# Patient Record
Sex: Male | Born: 1980 | Race: White | Hispanic: No | Marital: Single | Smoking: Current every day smoker
Health system: Southern US, Community
[De-identification: ages and names within clinical notes are randomized; demographics above are authoritative.]

## PROBLEM LIST (undated history)

## (undated) DIAGNOSIS — G43909 Migraine, unspecified, not intractable, without status migrainosus: Secondary | ICD-10-CM

## (undated) HISTORY — PX: APPENDECTOMY: SHX54

## (undated) HISTORY — PX: EYE SURGERY: SHX253

---

## 2001-11-23 ENCOUNTER — Encounter: Payer: Self-pay | Admitting: Emergency Medicine

## 2001-11-23 ENCOUNTER — Emergency Department (HOSPITAL_COMMUNITY): Admission: EM | Admit: 2001-11-23 | Discharge: 2001-11-23 | Payer: Self-pay | Admitting: Emergency Medicine

## 2001-12-05 ENCOUNTER — Emergency Department (HOSPITAL_COMMUNITY): Admission: EM | Admit: 2001-12-05 | Discharge: 2001-12-05 | Payer: Self-pay | Admitting: *Deleted

## 2002-07-20 ENCOUNTER — Emergency Department (HOSPITAL_COMMUNITY): Admission: EM | Admit: 2002-07-20 | Discharge: 2002-07-20 | Payer: Self-pay | Admitting: Emergency Medicine

## 2002-07-20 ENCOUNTER — Encounter: Payer: Self-pay | Admitting: Emergency Medicine

## 2002-07-21 ENCOUNTER — Emergency Department (HOSPITAL_COMMUNITY): Admission: EM | Admit: 2002-07-21 | Discharge: 2002-07-21 | Payer: Self-pay

## 2004-12-28 ENCOUNTER — Emergency Department (HOSPITAL_COMMUNITY): Admission: EM | Admit: 2004-12-28 | Discharge: 2004-12-28 | Payer: Self-pay | Admitting: Emergency Medicine

## 2011-06-19 ENCOUNTER — Emergency Department (HOSPITAL_COMMUNITY)
Admission: EM | Admit: 2011-06-19 | Discharge: 2011-06-20 | Disposition: A | Discharge status: Other Acute Inpatient Hospital | Payer: 59 | Attending: Emergency Medicine | Admitting: Emergency Medicine

## 2011-06-19 ENCOUNTER — Encounter (HOSPITAL_COMMUNITY): Payer: Self-pay | Admitting: Emergency Medicine

## 2011-06-19 DIAGNOSIS — G479 Sleep disorder, unspecified: Secondary | ICD-10-CM | POA: Insufficient documentation

## 2011-06-19 DIAGNOSIS — IMO0002 Reserved for concepts with insufficient information to code with codable children: Secondary | ICD-10-CM | POA: Insufficient documentation

## 2011-06-19 DIAGNOSIS — R45851 Suicidal ideations: Secondary | ICD-10-CM | POA: Insufficient documentation

## 2011-06-19 DIAGNOSIS — F603 Borderline personality disorder: Secondary | ICD-10-CM | POA: Insufficient documentation

## 2011-06-19 DIAGNOSIS — R45850 Homicidal ideations: Secondary | ICD-10-CM | POA: Insufficient documentation

## 2011-06-19 HISTORY — DX: Migraine, unspecified, not intractable, without status migrainosus: G43.909

## 2011-06-19 LAB — DIFFERENTIAL
Basophils Relative: 0 % (ref 0–1)
Lymphocytes Relative: 11 % — ABNORMAL LOW (ref 12–46)
Lymphs Abs: 1.3 10*3/uL (ref 0.7–4.0)
Monocytes Relative: 4 % (ref 3–12)
Neutro Abs: 10.3 10*3/uL — ABNORMAL HIGH (ref 1.7–7.7)
Neutrophils Relative %: 84 % — ABNORMAL HIGH (ref 43–77)

## 2011-06-19 LAB — RAPID URINE DRUG SCREEN, HOSP PERFORMED
Barbiturates: NOT DETECTED
Opiates: NOT DETECTED
Tetrahydrocannabinol: POSITIVE — AB

## 2011-06-19 LAB — COMPREHENSIVE METABOLIC PANEL
Albumin: 4.9 g/dL (ref 3.5–5.2)
Alkaline Phosphatase: 112 U/L (ref 39–117)
BUN: 14 mg/dL (ref 6–23)
CO2: 26 mEq/L (ref 19–32)
Chloride: 98 mEq/L (ref 96–112)
GFR calc non Af Amer: >90 mL/min (ref >90)
Potassium: 4 mEq/L (ref 3.5–5.1)
Total Bilirubin: 0.4 mg/dL (ref 0.3–1.2)

## 2011-06-19 LAB — CBC
HCT: 46 % (ref 39.0–52.0)
Hemoglobin: 16.3 g/dL (ref 13.0–17.0)
RBC: 4.88 MIL/uL (ref 4.22–5.81)
WBC: 12.3 10*3/uL — ABNORMAL HIGH (ref 4.0–10.5)

## 2011-06-19 MED ORDER — IBUPROFEN 600 MG PO TABS
600.0000 mg | ORAL_TABLET | Freq: Three times a day (TID) | ORAL | Status: DC | PRN
  Administered 2011-06-19: 600 mg via ORAL
  Filled 2011-06-19: qty 1

## 2011-06-19 MED ORDER — NICOTINE 21 MG/24HR TD PT24
21.0000 mg | MEDICATED_PATCH | Freq: Every day | TRANSDERMAL | Status: DC
  Filled 2011-06-19: qty 1

## 2011-06-19 MED ORDER — ONDANSETRON HCL 4 MG PO TABS: 4.0000 mg | ORAL_TABLET | Freq: Three times a day (TID) | ORAL | Status: DC | PRN

## 2011-06-19 MED ORDER — METOCLOPRAMIDE HCL 10 MG PO TABS
10.0000 mg | ORAL_TABLET | Freq: Four times a day (QID) | ORAL | Status: DC | PRN
  Administered 2011-06-19: 10 mg via ORAL
  Filled 2011-06-19: qty 1

## 2011-06-19 MED ORDER — HYDROCODONE-ACETAMINOPHEN 5-325 MG PO TABS
2.0000 | ORAL_TABLET | Freq: Once | ORAL | Status: AC
  Administered 2011-06-19: 2 via ORAL
  Filled 2011-06-19: qty 2

## 2011-06-19 MED ORDER — ACETAMINOPHEN 325 MG PO TABS: 650.0000 mg | ORAL_TABLET | ORAL | Status: DC | PRN

## 2011-06-19 MED ORDER — LORAZEPAM 1 MG PO TABS: 1.0000 mg | ORAL_TABLET | Freq: Three times a day (TID) | ORAL | Status: DC | PRN

## 2011-06-19 NOTE — Progress Notes (Signed)
[phone] Taylor Rowe Please call if pt needs ride home

## 2011-06-19 NOTE — ED Notes (Signed)
Pt would like to talk to someone about issues with anger. Pt states he doesn't want to hurt himself right now but when he became angry last night, he thought about driving his truck off of a bridge. Pt states he feels like he could hurt someone else when he becomes angry. Pt calm and cooperative at this time.

## 2011-06-19 NOTE — ED Provider Notes (Signed)
Typical daily headache lasts hours not better with APAP/Motrin so will try Reglan po, denies sudden or severe or change in H/A from typical, denies change speech/vision/weak/numb. 1950  Hurman Horn, MD 06/20/11 1322

## 2011-06-19 NOTE — ED Notes (Signed)
All of pt's stuff sent home with family prior to coming to Psych ED
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
## 2011-06-19 NOTE — ED Provider Notes (Signed)
History     CSN: [id number]  Arrival date & time 06/19/11  1114   First MD Initiated Contact with Patient 06/19/11 1206      Chief Complaint  Patient presents with  . Medical Clearance    (Consider location/radiation/quality/duration/timing/severity/associated sxs/prior treatment) HPI Comments: Patient presents with complaints of fits of anger that he cannot control. These have been becoming more frequent and intense. He states that when he is feeling angry he has thoughts of hurting himself (driving off a bridge) and hurting other people -- however he denies this at current time. Occasional alcohol, marijuana, and prescription drug use. No medical complaints at this time.   Patient is a 31 y.o. male presenting with mental health disorder. The history is provided by the patient.  Mental Health Problem  The onset of the illness is precipitated by emotional stress. Additional symptoms of the illness do not include no headaches or no abdominal pain. He contemplates harming himself. He contemplates injuring another person.    Past Medical History  Diagnosis Date  . Migraines     Past Surgical History  Procedure Date  . Eye surgery   . Appendectomy     History reviewed. No pertinent family history.  History  Substance Use Topics  . Smoking status: Current Everyday Smoker -- 1.0 packs/day  . Smokeless tobacco: Not on file  . Alcohol Use: 1.2 oz/week    2 Cans of beer per week     most days      Review of Systems  Constitutional: Negative for fever.  HENT: Negative for sore throat and rhinorrhea.   Eyes: Negative for redness.  Respiratory: Negative for cough.   Cardiovascular: Negative for chest pain.  Gastrointestinal: Negative for nausea, vomiting, abdominal pain and diarrhea.  Genitourinary: Negative for dysuria.  Musculoskeletal: Negative for myalgias.  Skin: Negative for rash.  Neurological: Negative for headaches.  Psychiatric/Behavioral: Positive for suicidal  ideas, behavioral problems and sleep disturbance.    Allergies  Other  Home Medications   Current Outpatient Rx  Name Route Sig Dispense Refill  . ADULT MULTIVITAMIN W/MINERALS CH Oral Take 1 tablet by mouth daily.      BP 129/82  Pulse 105  Temp(Src) 98.2 F (36.8 C) (Oral)  Ht 5\' 7"  (1.702 m)  Wt 170 lb (77.111 kg)  BMI 26.63 kg/m2  SpO2 100%  Physical Exam  Nursing note and vitals reviewed. Constitutional: He is oriented to person, place, and time. He appears well-developed and well-nourished.  HENT:  Head: Normocephalic and atraumatic.  Eyes: Conjunctivae are normal. Right eye exhibits no discharge. Left eye exhibits no discharge.  Neck: Normal range of motion. Neck supple.  Cardiovascular: Normal rate, regular rhythm and normal heart sounds.   Pulmonary/Chest: Effort normal and breath sounds normal.  Abdominal: Soft. There is no tenderness.  Neurological: He is alert and oriented to person, place, and time.  Skin: Skin is warm and dry.  Psychiatric: He has a normal mood and affect.    ED Course  Procedures (including critical care time)  Labs Reviewed  CBC - Abnormal; Notable for the following:    WBC 12.3 (*)    All other components within normal limits  DIFFERENTIAL - Abnormal; Notable for the following:    Neutrophils Relative 84 (*)    Neutro Abs 10.3 (*)    Lymphocytes Relative 11 (*)    All other components within normal limits  URINE RAPID DRUG SCREEN (HOSP PERFORMED) - Abnormal; Notable for the following:  Amphetamines POSITIVE (*)    Tetrahydrocannabinol POSITIVE (*)    All other components within normal limits  COMPREHENSIVE METABOLIC PANEL  ETHANOL   No results found.   1. Behavior problem     12:22 PM Patient seen and examined. Work-up initiated. Will need ACT consult.   Vital signs reviewed and are as follows: Filed Vitals:   06/19/11 1122  BP: 129/82  Pulse: 105  Temp: 98.2 F (36.8 C)   3:17 PM Spoke with ACT who will see.  Patient is medically cleared.   8:22 PM Handoff to Dr. Fonnie Jarvis.    MDM  Pt denies SI/HI currently, would benefit from ACT eval possible telepsych.         Renne Crigler, [place] 06/19/11 2022

## 2011-06-20 ENCOUNTER — Inpatient Hospital Stay (HOSPITAL_COMMUNITY)
Admission: AD | Admit: 2011-06-20 | Discharge: 2011-06-23 | DRG: 883 | Disposition: A | Discharge status: Home / Self Care | Payer: 59 | Source: Ambulatory Visit | Attending: Psychiatry | Admitting: Psychiatry

## 2011-06-20 DIAGNOSIS — G43909 Migraine, unspecified, not intractable, without status migrainosus: Secondary | ICD-10-CM

## 2011-06-20 DIAGNOSIS — F6381 Intermittent explosive disorder: Principal | ICD-10-CM | POA: Diagnosis present

## 2011-06-20 DIAGNOSIS — F1994 Other psychoactive substance use, unspecified with psychoactive substance-induced mood disorder: Secondary | ICD-10-CM | POA: Diagnosis present

## 2011-06-20 DIAGNOSIS — IMO0002 Reserved for concepts with insufficient information to code with codable children: Secondary | ICD-10-CM

## 2011-06-20 DIAGNOSIS — F4310 Post-traumatic stress disorder, unspecified: Secondary | ICD-10-CM | POA: Diagnosis not present

## 2011-06-20 DIAGNOSIS — Z79899 Other long term (current) drug therapy: Secondary | ICD-10-CM

## 2011-06-20 DIAGNOSIS — F17200 Nicotine dependence, unspecified, uncomplicated: Secondary | ICD-10-CM

## 2011-06-20 DIAGNOSIS — Z888 Allergy status to other drugs, medicaments and biological substances status: Secondary | ICD-10-CM

## 2011-06-20 MED ORDER — ADULT MULTIVITAMIN W/MINERALS CH
1.0000 | ORAL_TABLET | Freq: Every day | ORAL | Status: DC
  Administered 2011-06-21 – 2011-06-23 (×3): 1 via ORAL
  Filled 2011-06-20 (×4): qty 1

## 2011-06-20 MED ORDER — ACETAMINOPHEN 325 MG PO TABS
650.0000 mg | ORAL_TABLET | Freq: Four times a day (QID) | ORAL | Status: DC | PRN
  Administered 2011-06-20 – 2011-06-23 (×5): 650 mg via ORAL

## 2011-06-20 MED ORDER — HYDROXYZINE HCL 50 MG PO TABS
50.0000 mg | ORAL_TABLET | Freq: Every evening | ORAL | Status: DC | PRN
  Administered 2011-06-20 – 2011-06-21 (×4): 50 mg via ORAL
  Filled 2011-06-20 (×8): qty 1

## 2011-06-20 MED ORDER — NICOTINE 14 MG/24HR TD PT24
MEDICATED_PATCH | TRANSDERMAL | Status: AC
  Filled 2011-06-20: qty 1

## 2011-06-20 MED ORDER — ALUM & MAG HYDROXIDE-SIMETH 200-200-20 MG/5ML PO SUSP
30.0000 mL | ORAL | Status: DC | PRN
  Administered 2011-06-21 – 2011-06-22 (×2): 30 mL via ORAL

## 2011-06-20 MED ORDER — HYDROXYZINE HCL 25 MG PO TABS
25.0000 mg | ORAL_TABLET | ORAL | Status: DC | PRN
  Administered 2011-06-21: 25 mg via ORAL
  Filled 2011-06-20: qty 20

## 2011-06-20 MED ORDER — DIVALPROEX SODIUM 500 MG PO DR TAB
500.0000 mg | DELAYED_RELEASE_TABLET | Freq: Two times a day (BID) | ORAL | Status: DC
  Administered 2011-06-20 – 2011-06-22 (×4): 500 mg via ORAL
  Filled 2011-06-20 (×9): qty 1

## 2011-06-20 MED ORDER — NICOTINE 14 MG/24HR TD PT24
14.0000 mg | MEDICATED_PATCH | Freq: Every day | TRANSDERMAL | Status: DC
  Administered 2011-06-22: 14 mg via TRANSDERMAL
  Filled 2011-06-20 (×5): qty 1

## 2011-06-20 MED ORDER — MAGNESIUM HYDROXIDE 400 MG/5ML PO SUSP: 30.0000 mL | Freq: Every day | ORAL | Status: DC | PRN

## 2011-06-20 NOTE — Progress Notes (Signed)
[facility] Group Notes:  (Counselor/Nursing/MHT/Case Management/Adjunct)  06/20/2011 2:35 PM  Type of Therapy:  Group Therapy  Participation Level:  Did Not Attend   Neila Gear 06/20/2011, 2:35 PM

## 2011-06-20 NOTE — Progress Notes (Signed)
06/20/2011 Nursing 2200 Taylor Rowe spent most of his day...sleeping in his bed.he did get up and take his meals in the cafeteria...going back to sleep immediately when he returned to the unit. He is sad, depressed yet laughs and minimizes the extent of his mental health issues. WHen he was confronted by this nurse... ie  if he wanted help he needed to attend his groups as well as pay attention to the group  Discussion,he didn't like it...but after he walked away he returned to speak with this nurse, saying " ok...tell me what I need to do to make this work". A He is started on Depakote this PM per MD order. R Safety is maintaiend . Pt attended his PM Wrap-Up group. Cont to support POC as well as foster therapeutic relationship already establsihed PD RN [facility]

## 2011-06-20 NOTE — [facility] Counselor (Signed)
Pt accepted to [facility] by Dr. Baron Sane to Dr. Tilman Neat services.  He will go to room 500-1 and will be transported by security.  Nurse needs to do:  Call Report - [phone] Complete COBRA Send discharge paper work Arts administrator for transport

## 2011-06-20 NOTE — ED Notes (Signed)
Pt accepted to [facility], bogart to walker 500-1.

## 2011-06-20 NOTE — ED Notes (Signed)
Per Annice Pih at [employer], pt's [facility] coverage has lapsed. Annice Pih informed this Clinical research associate that [facility] does not have any [employer] currently available.

## 2011-06-20 NOTE — ED Provider Notes (Signed)
Pt seen and evaluated in psych Ed, pt sleeping comfortably but arousable and vital signs stable.  He is currently awaiting placement  9:34 AM Pt accepted at [facility] by Dr. Baron Sane.    Ethelda Chick, MD 06/20/11 1248
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
## 2011-06-21 DIAGNOSIS — F909 Attention-deficit hyperactivity disorder, unspecified type: Secondary | ICD-10-CM

## 2011-06-21 DIAGNOSIS — F4310 Post-traumatic stress disorder, unspecified: Secondary | ICD-10-CM | POA: Diagnosis not present

## 2011-06-21 MED ORDER — NICOTINE 21 MG/24HR TD PT24
MEDICATED_PATCH | TRANSDERMAL | Status: AC
  Filled 2011-06-21: qty 1

## 2011-06-21 MED ORDER — BUTALBITAL-APAP-CAFFEINE 50-325-40 MG PO TABS
2.0000 | ORAL_TABLET | Freq: Once | ORAL | Status: AC
  Administered 2011-06-21: 2 via ORAL
  Filled 2011-06-21 (×2): qty 2

## 2011-06-21 NOTE — Progress Notes (Signed)
06/22/2011 Nursing 0900 Taylor Rowe is out in the hall...interacting with the other pts early this AM. He presents to the med window first thing and says he's " ready" for the day.he makes good eye contact. He is pleasant, cooperatvie and articulate. HE smiles boyishly after taking his meds and asking this nurse...." Is that all???". A He completed his self invnentory sheet and on it he wrote he denied SI, he did not rate his depression and hopelessness but stated his DC plan was: " plan to take care of my family and muyself". R Safety is maintained. POC includes continuing to foster therapeutic relationship already established and pt given written material about PTSD and developing healthier coping mechnisms discussed with pt PD RN [facility]

## 2011-06-21 NOTE — Progress Notes (Signed)
[facility] Group Notes:  (Counselor/Nursing/MHT/Case Management/Adjunct)  06/21/2011 3:33 PM  Type of Therapy:  Group Therapy  Participation Level:  Did Not Attend   Taylor Rowe 06/21/2011, 3:33 PM
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
## 2011-06-21 NOTE — Progress Notes (Signed)
Patient ID: Taylor Rowe, male   DOB: [birth year], 31 y.o.   MRN: [id number] Pt. Reports anger issues for most of his life, reports he does know what it's from. Writer encouraged pt. To explore childhood he may find the answer, also encouraged pt. To be aware of the negatively that anger can cause. Pt. Reports irritability also. Pt. Denies depression. Staff will monitor q81min for safety. Pt. Remains safe on the floor.

## 2011-06-21 NOTE — H&P (Signed)
Patient was seen for suicidal assessment and case discussed with Ms. Adams and agree with admission and treatment plan.  

## 2011-06-21 NOTE — Progress Notes (Signed)
06/21/2011  Nursing 1500 Taylor Rowe has done fair today...he was up first thing this morning....coming to the medications window and asking " what's next???/" He remains depressed, flat and minimizing the lattitude of his problems, frequently he laughs and jokes and makes insulting comments that degrade HIM for have mental illness. A He  Completed his self inventory sheet and on it he wrote he denied SI and wrote his DC plan was : " plan to take care of my family"...  R safety is maintained and POC includes continuing to foster therapeutic relationsjip and maintain boundaries with pt already establsihed PD RN [facility]

## 2011-06-21 NOTE — Progress Notes (Signed)
[facility] Adult Inpatient Family/Significant Other Suicide Prevention Education  Suicide Prevention Education:  Education Completed; Taylor Rowe-[phone]-Fiance- has been identified by the patient as the family member/significant other with whom the patient will be residing, and identified as the person(s) who will aid the patient in the event of a mental health crisis (suicidal ideations/suicide attempt).  With written consent from the patient, the family member/significant other has been provided the following suicide prevention education, prior to the and/or following the discharge of the patient.  The suicide prevention education provided includes the following:  Suicide risk factors  Suicide prevention and interventions  National Suicide Hotline telephone number  [facility] assessment telephone number  [facility] Emergency Assistance 911  [facility] and/or Residential Mobile Crisis Unit telephone number  Request made of family/significant other to:  Remove weapons (e.g., guns, rifles, knives), all items previously/currently identified as safety concern.  Pt.'s girlfriend will secure guns in the home. She states there is s shot gun and two BB guns that she will remove and take to her mothers home.   Remove drugs/medications (over-the-counter, prescriptions, illicit drugs), all items previously/currently identified as a safety concern. Pt.'s girl friend will secure home. She had no concerns  Pt.'s girlfriend discussed pt.' s problem with anger and situation that brought pt. to [facility]. Pt.'s girlfriend wants pt. To get help and feels the pt. Would benefit from individual counseling. Pt. Stated he would like a referral also. Pt.'s girlfriend can be reached at the number above.  The family member/significant other verbalizes understanding of the suicide prevention education information provided.  The family member/significant other agrees to remove the items of safety  concern listed above.  Taylor Rowe 06/21/2011, 11:48 AM

## 2011-06-21 NOTE — Progress Notes (Signed)
Pt requested repeat vistaril at 2320. Worried he would not be able to sleep however medication was effective. His RR are even, unlabored and WNL. Level III obs maintained. Pt safe. Lawrence Marseilles

## 2011-06-21 NOTE — Progress Notes (Signed)
Patient ID: Taylor Rowe, male   DOB: 14-Mar-1981, 31 y.o.   MRN: [id number]  Pt. attended and participated in aftercare planning group. Pt. listed their current anxiety level as 0 and depression as 0. Pt shared that he was not feeling S/I and H/I "not today."

## 2011-06-22 DIAGNOSIS — F6381 Intermittent explosive disorder: Principal | ICD-10-CM

## 2011-06-22 MED ORDER — TRAZODONE HCL 100 MG PO TABS
100.0000 mg | ORAL_TABLET | Freq: Every day | ORAL | Status: DC
  Administered 2011-06-22: 100 mg via ORAL
  Filled 2011-06-22 (×3): qty 1

## 2011-06-22 MED ORDER — BUTALBITAL-APAP-CAFFEINE 50-325-40 MG PO TABS
2.0000 | ORAL_TABLET | ORAL | Status: AC
  Administered 2011-06-22: 2 via ORAL
  Filled 2011-06-22 (×2): qty 2

## 2011-06-22 MED ORDER — NICOTINE 21 MG/24HR TD PT24
21.0000 mg | MEDICATED_PATCH | Freq: Every day | TRANSDERMAL | Status: DC
  Administered 2011-06-22 – 2011-06-23 (×2): 21 mg via TRANSDERMAL
  Filled 2011-06-22 (×2): qty 1

## 2011-06-22 MED ORDER — DIVALPROEX SODIUM ER 500 MG PO TB24
1000.0000 mg | ORAL_TABLET | Freq: Every day | ORAL | Status: DC
  Administered 2011-06-22: 1000 mg via ORAL
  Filled 2011-06-22 (×3): qty 2

## 2011-06-22 MED ORDER — NICOTINE 21 MG/24HR TD PT24
MEDICATED_PATCH | TRANSDERMAL | Status: AC
  Administered 2011-06-22: 09:00:00
  Filled 2011-06-22: qty 1

## 2011-06-22 MED ORDER — TRAZODONE HCL 50 MG PO TABS
50.0000 mg | ORAL_TABLET | Freq: Every evening | ORAL | Status: DC | PRN
Start: 2011-06-22 — End: 2011-06-22
  Filled 2011-06-22 (×4): qty 1
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
## 2011-06-22 NOTE — Progress Notes (Signed)
Pt given pain medication for his headache that he rated a 3. Wanted to know what the medication was and was happy when he found out that it was a narcotic. Given support and reassurance.

## 2011-06-22 NOTE — Progress Notes (Signed)
[facility] Group Notes:  (Counselor/Nursing/MHT/Case Management/Adjunct) 11:00am   Type of Therapy:  Group Therapy  Participation Level:  Did Not Attend  Billie Lade 06/22/2011  12:55 PM

## 2011-06-22 NOTE — ED Provider Notes (Signed)
Medical screening examination/treatment/procedure(s) were performed by non-physician practitioner and as supervising physician I was immediately available for consultation/collaboration.   Benny Lennert, MD 06/22/11 1116

## 2011-06-22 NOTE — Progress Notes (Signed)
Patient ID: Taylor Rowe, male   DOB: 03-14-1981, 31 y.o.   MRN: [id number] Pt. Attended group. Pt. Reports concerns about discharge. Pt. Denies SHI. Pt. Reports Anxiety at "4", "I'm not irritated or anything." Pt. Reports he has several resource set up for discharge. Pt. Says he didn't get to see the physician today and wants to talk to him about discharge. Staff will monitor q21min for safety. Pt. Remains safe on the unit.
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
## 2011-06-22 NOTE — Progress Notes (Signed)
[facility] Group Notes: (Counselor/Nursing/MHT/Case Management/Adjunct) 06/22/2011   @1 :15pm  Type of Therapy:  Group Therapy  Participation Level:    Participation Quality:    Affect:    Cognitive:  Appropriate  Insight:    Engagement in Group:   Engagement in Therapy:    Modes of Intervention:  Support and Exploration  Summary of Progress/Problems: Taylor Rowe was engaged in group, though he had a tendency to be off-topic and distracting. He spoke about the link among prompting events, emotions and behaviors, pointing out that sometimes negative behaviors are worth the consequences if you believe in the reason/principle behind it. This began a discussion of value-focused behavior, and Taylor Rowe made several comments about men not being able to hit women, but being permitted to hit other men. Other group members pointed out that this is a matter of belief, and that some people believe that hitting anyone is never justified. Taylor Rowe agreed with this and went on to talk about his reactions to anger.   Billie Lade 06/22/2011 3:31 PM

## 2011-06-23 DIAGNOSIS — F4310 Post-traumatic stress disorder, unspecified: Secondary | ICD-10-CM

## 2011-06-23 DIAGNOSIS — F1994 Other psychoactive substance use, unspecified with psychoactive substance-induced mood disorder: Secondary | ICD-10-CM

## 2011-06-23 MED ORDER — DIVALPROEX SODIUM ER 500 MG PO TB24: 1000.0000 mg | ORAL_TABLET | Freq: Every day | ORAL | Status: DC

## 2011-06-23 MED ORDER — ADULT MULTIVITAMIN W/MINERALS CH: 1.0000 | ORAL_TABLET | Freq: Every day | ORAL | Status: DC

## 2011-06-23 MED ORDER — HYDROXYZINE HCL 25 MG PO TABS: 25.0000 mg | ORAL_TABLET | ORAL | Status: AC | PRN

## 2011-06-23 MED ORDER — TRAZODONE HCL 100 MG PO TABS: 100.0000 mg | ORAL_TABLET | Freq: Every day | ORAL | Status: AC
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
## 2011-06-23 NOTE — Progress Notes (Signed)
[facility] Case Management Discharge Plan:  Will you be returning to the same living situation after discharge: Yes,  return to own home At discharge, do you have transportation home?:Yes,  has transportation home Do you have the ability to pay for your medications:Yes,  access to meds, samples provided  Interagency Information:     Release of information consent forms completed and in the chart;  Patient's signature needed at discharge.  Patient to Follow up at:  Follow-up Information    Follow up with [facility] on 06/24/2011. (Appointment scheduled at 2:00 pm with Mickie Hillier. (therapy))    Contact information:   [address] [place], [place] [address] [phone]      Follow up with [facility] on 06/25/2011. (Appointment scheduled at 2:30 pm with Robin B. (medication management))    Contact information:   [address] [place], [place] [address] [phone]         Patient denies SI/HI:   Yes,  denies SI/HI    Safety Planning and Suicide Prevention discussed:  Yes,  discussed with pt  Barrier to discharge identified:No.  Summary and Recommendations: Pt attended discharge planning group and actively participated.  Pt presents with calm mood and affect.  Pt ranks depression at a 0 and anxiety at a 3, stating he is ready to d/c.  Pt denies SI.  No recommendations from SW.  No further needs voiced by pt.  Pt stable to discharge.     Carmina Miller 06/23/2011, 10:26 AM

## 2011-06-23 NOTE — Progress Notes (Signed)
Grief and loss group  Facilitated grief and loss group on [address] [facility]. Discussed group rules and limits of privacy/confidentiality. Discussed types of loss and normal grief reactions. Invited group members to share experiences w/ grief; topics focused on death/dying and interpersonal losses. Group was initially quiet but progressively engaged, was supportive of one another.   Pt was active and engaged in group, shared w/ group that he had encountered loss earlier in life but did not elaborate. Pt stated that he has dealt w/ grief by trying to move forward and care for self, recognized this is hard. Pt was supportive of other members and empathized w/ those struggling w/ death.  Ambra Haverstick B MS, LPCA, NCC

## 2011-06-23 NOTE — Tx Team (Signed)
Interdisciplinary Treatment Plan Update (Adult)  Date:  06/23/2011  Time Reviewed:  10:22 AM   Progress in Treatment: Attending groups: Yes Participating in groups:  Yes Taking medication as prescribed: Yes Tolerating medication:  Yes Family/Significant other contact made:  Yes Patient understands diagnosis:  Yes Discussing patient identified problems/goals with staff:  Yes Medical problems stabilized or resolved:  Yes Denies suicidal/homicidal ideation: Yes Issues/concerns per patient self-inventory:  None identified Other: N/A  New problem(s) identified: None Identified  Reason for Continuation of Hospitalization: Stable to d/c  Interventions implemented related to continuation of hospitalization: Stable to d/c  Additional comments: N/A  Estimated length of stay: D/C today  Discharge Plan: Pt will follow up at [facility] for medication management and therapy.    New goal(s): N/A  Review of initial/current patient goals per problem list:    1.  Goal(s): Reduce depressive and anxiety symptoms  Met:  Yes   Target date: by discharge  As evidenced by: Reducing depression from a 10 to a 3 as reported by pt. Pt denies having depression and anxiety.  2.  Goal (s): Reduce/Eliminate suicidal ideation  Met:  Yes  Target date: by discharge  As evidenced by: pt reporting no SI.    3.  Goal(s): Anger Issues  Met:  Yes  Target date: by discharge  As evidenced by: Pt reports having calm mood and was referred to therapist that works with anger issues   Attendees: Patient:  Taylor Rowe 06/23/2011 10:24 AM   Family:     Physician:  Franchot Gallo, MD  06/23/2011  10:22 AM   Nursing:   Roswell Miners, RN 06/23/2011 10:24 AM   Case Manager:  Reyes Ivan, LCSWA 06/23/2011  10:22 AM   Counselor:  Angus Palms, LCSW 06/23/2011  10:22 AM   Other:  Juline Patch, LCSW 06/23/2011  10:22 AM   Other:  06/23/2011  10:22 AM   Other:     Other:       Scribe for Treatment Team:   Carmina Miller, 06/23/2011 , 10:22 AM

## 2011-06-23 NOTE — Progress Notes (Signed)
Patient ID: Taylor Rowe, male   DOB: 06/14/1980, 31 y.o.   MRN: [id number] He has been up and to groups interacting with peers and staff. Requested and received tylenol nor neck pain" stated he slept on it wrong". He denies SI thoughts, slept  Well, appetite improving, energy normal,and attention is good.

## 2011-06-23 NOTE — Progress Notes (Signed)
Patient ID: Taylor Rowe, male   DOB: Aug 20, 1980, 31 y.o.   MRN: [id number] Pt. Discharged home. He voiced that he understood discharge instructions and follow up plan. He denies thoughts of harming self  Or others. All belongings taken home with him.
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
## 2011-06-26 NOTE — Progress Notes (Signed)
Patient Discharge Instructions:  Psychiatric Admission Assessment Note Provided,  06/25/2011 After Visit Summary (AVS) Provided,  06/25/2011 Face Sheet Provided, 06/25/2011 Faxed/Sent to the Next Level Care provider:  06/25/2011 Sent Suicide Risk Assessment - Discharge Assessment 06/25/2011  Faxed to [facility] counseling @ [phone]  Wandra Scot, 06/26/2011, 1:13 PM

## 2011-06-26 NOTE — Discharge Summary (Signed)
I agree with this D/C Summary.  

## 2011-07-03 ENCOUNTER — Emergency Department (HOSPITAL_COMMUNITY)
Admission: EM | Admit: 2011-07-03 | Discharge: 2011-07-03 | Disposition: A | Discharge status: Home / Self Care | Payer: 59 | Attending: Emergency Medicine | Admitting: Emergency Medicine

## 2011-07-03 DIAGNOSIS — F17200 Nicotine dependence, unspecified, uncomplicated: Secondary | ICD-10-CM | POA: Insufficient documentation

## 2011-07-03 DIAGNOSIS — G43909 Migraine, unspecified, not intractable, without status migrainosus: Secondary | ICD-10-CM | POA: Insufficient documentation

## 2011-07-03 DIAGNOSIS — Z79899 Other long term (current) drug therapy: Secondary | ICD-10-CM | POA: Insufficient documentation

## 2011-07-03 DIAGNOSIS — Z5189 Encounter for other specified aftercare: Secondary | ICD-10-CM

## 2011-07-03 MED ORDER — DIVALPROEX SODIUM 500 MG PO DR TAB: 500.0000 mg | DELAYED_RELEASE_TABLET | Freq: Once | ORAL | Status: DC

## 2011-07-03 NOTE — ED Provider Notes (Signed)
5:01 PM Pt care assumed from PA Greene. Depakote level not toxic. Dose will be reduced to help with side effects. Pt to be discharged home.  Shaaron Adler, [place] 07/03/11 1702

## 2011-07-03 NOTE — ED Notes (Signed)
Started on Rx for mood disorder 2 weeks ago. States started making him feel "weird..nausea/vomiting...feeling flushed".

## 2011-07-03 NOTE — ED Provider Notes (Signed)
History     CSN: [id number]  Arrival date & time 07/03/11  1231   First MD Initiated Contact with Patient 07/03/11 1237      Chief Complaint  Patient presents with  . Allergic Reaction    Started new med for mood disorder 2 weeks ago    (Consider location/radiation/quality/duration/timing/severity/associated sxs/prior treatment) HPI  Patient presents to the ED with complaints of not being able to tolerate is Depakote medication well. He states that he has been taking it for two weeks and has noticed that he has felt flushed and shaky. He says it makes him feel "weird". He denies having weakness, rash, difficulty breathing, lethargy, LOC or any other complaints.  Past Medical History  Diagnosis Date  . Migraines     Past Surgical History  Procedure Date  . Eye surgery   . Appendectomy     No family history on file.  History  Substance Use Topics  . Smoking status: Current Everyday Smoker -- 1.0 packs/day  . Smokeless tobacco: Not on file  . Alcohol Use: 1.2 oz/week    2 Cans of beer per week     most days      Review of Systems  All other systems reviewed and are negative.    Allergies  Other  Home Medications   Current Outpatient Rx  Name Route Sig Dispense Refill  . GOODY HEADACHE PO Oral Take 1 packet by mouth every 6 (six) hours as needed. pain    . DIVALPROEX SODIUM ER 500 MG PO TB24 Oral Take 2 tablets (1,000 mg total) by mouth at bedtime. For mood control 60 tablet 0  . HYDROXYZINE HCL 25 MG PO TABS Oral Take 1 tablet (25 mg total) by mouth every 4 (four) hours as needed for anxiety. For anxiety and sleep 120 tablet 0  . ADULT MULTIVITAMIN W/MINERALS CH Oral Take 1 tablet by mouth daily. Vitamin supplement 30 tablet 0  . TRAZODONE HCL 100 MG PO TABS Oral Take 1 tablet (100 mg total) by mouth at bedtime. For sleep. 30 tablet 0  . DIVALPROEX SODIUM 500 MG PO TBEC Oral Take 1 tablet (500 mg total) by mouth once. 30 tablet 0    BP 117/75  Pulse 94   Temp(Src) 97.7 F (36.5 C) (Oral)  Resp 20  SpO2 100%  Physical Exam  Nursing note and vitals reviewed. Constitutional: He appears well-developed and well-nourished. No distress.  HENT:  Head: Normocephalic and atraumatic.  Eyes: Pupils are equal, round, and reactive to light.  Neck: Normal range of motion. Neck supple.  Cardiovascular: Normal rate and regular rhythm.   Pulmonary/Chest: Effort normal.  Abdominal: Soft.  Neurological: He is alert.  Skin: Skin is warm and dry.    ED Course  Procedures (including critical care time)   Labs Reviewed  VALPROIC ACID LEVEL   No results found.   1. Encounter for medication adjustment       MDM  Pt received Telepsych for medication adjustments. He recommends ordering Depakote levels to ensure that the patients levels are not toxic. So long as the patients levels are within normal ranges, the patient can take Depakote 500 mg qd, instead of Depakote 1000mg  qd.  Pt telepsych done by Sela Hua, MD.   End of shift care handed off to [facility], PA-C. If patient Depakote levels are toxic then he needs to be admitted. Otherwise, his prescription has been changed to 500mg  qd and he needs to follow-up with his psychiatrist.  Pt has been advised of the symptoms that warrant their return to the ED. Patient has voiced understanding and has agreed to follow-up with the PCP or specialist.     Dorthula Matas, PA 07/03/11 1609

## 2011-07-03 NOTE — ED Notes (Signed)
Paper work sent to [employer]. Confirmed receipt.

## 2011-07-03 NOTE — ED Provider Notes (Signed)
Medical screening examination/treatment/procedure(s) were performed by non-physician practitioner and as supervising physician I was immediately available for consultation/collaboration.   Lyanne Co, MD 07/03/11 [phone]

## 2011-07-04 NOTE — ED Provider Notes (Signed)
Medical screening examination/treatment/procedure(s) were performed by non-physician practitioner and as supervising physician I was immediately available for consultation/collaboration.   Lyanne Co, MD 07/04/11 1046

## 2011-10-04 ENCOUNTER — Encounter (HOSPITAL_COMMUNITY): Payer: Self-pay | Admitting: Emergency Medicine

## 2011-10-04 ENCOUNTER — Emergency Department (HOSPITAL_COMMUNITY)
Admission: EM | Admit: 2011-10-04 | Discharge: 2011-10-04 | Disposition: A | Discharge status: Home / Self Care | Payer: 59 | Attending: Emergency Medicine | Admitting: Emergency Medicine

## 2011-10-04 DIAGNOSIS — Z760 Encounter for issue of repeat prescription: Secondary | ICD-10-CM

## 2011-10-04 DIAGNOSIS — F319 Bipolar disorder, unspecified: Secondary | ICD-10-CM | POA: Insufficient documentation

## 2011-10-04 DIAGNOSIS — F17200 Nicotine dependence, unspecified, uncomplicated: Secondary | ICD-10-CM | POA: Insufficient documentation

## 2011-10-04 DIAGNOSIS — Z9089 Acquired absence of other organs: Secondary | ICD-10-CM | POA: Insufficient documentation

## 2011-10-04 MED ORDER — LORAZEPAM 1 MG PO TABS: 1.0000 mg | ORAL_TABLET | Freq: Three times a day (TID) | ORAL | Status: AC | PRN

## 2011-10-04 MED ORDER — DIVALPROEX SODIUM ER 500 MG PO TB24: 500.0000 mg | ORAL_TABLET | Freq: Every day | ORAL | Status: DC

## 2011-10-04 NOTE — ED Notes (Signed)
Pt alert, nad, arrives from home, presents requesting different medication for "bipolar", denies SI/HI, resp even unlabored, skin pwd

## 2011-10-04 NOTE — ED Provider Notes (Signed)
History     CSN: [id number]  Arrival date & time 10/04/11  2056   First MD Initiated Contact with Patient 10/04/11 2305      Chief Complaint  Patient presents with  . Medication Refill     The history is provided by the patient.   the patient reports a history of bipolar disorder.  He was started on Depakote and Ativan several months ago and had some improvement in his symptoms but then did not like the medication made him feel and thus he stopped his medications.  He now presents the emergency department stating he actually feels worse than when he was on his medications and would like to be back on medications at this time.  The patient was previously in Depakote and Ativan.  He has no homicidal or suicidal thoughts.  He has no other complaints.  Denies difficulty sleeping and denies impulsivity.  Nothing worsens the symptoms.  Nothing improves his symptoms.  Symptoms are constant  Past Medical History  Diagnosis Date  . Migraines     Past Surgical History  Procedure Date  . Eye surgery   . Appendectomy     No family history on file.  History  Substance Use Topics  . Smoking status: Current Everyday Smoker -- 1.0 packs/day  . Smokeless tobacco: Not on file  . Alcohol Use: 1.2 oz/week    2 Cans of beer per week     most days      Review of Systems  All other systems reviewed and are negative.    Allergies  Other  Home Medications   Current Outpatient Rx  Name Route Sig Dispense Refill  . DIVALPROEX SODIUM ER 500 MG PO TB24 Oral Take 1 tablet (500 mg total) by mouth daily. 30 tablet 1  . LORAZEPAM 1 MG PO TABS Oral Take 1 tablet (1 mg total) by mouth 3 (three) times daily as needed for anxiety. 25 tablet 0    BP 126/79  Pulse 83  Temp 98.3 F (36.8 C) (Oral)  Resp 18  SpO2 100%  Physical Exam  Nursing note and vitals reviewed. Constitutional: He is oriented to person, place, and time. He appears well-developed and well-nourished.  HENT:  Head:  Normocephalic and atraumatic.  Eyes: EOM are normal.  Neck: Normal range of motion.  Cardiovascular: Normal rate.   Pulmonary/Chest: Effort normal. No respiratory distress.  Abdominal: He exhibits no distension.  Musculoskeletal: Normal range of motion.  Neurological: He is alert and oriented to person, place, and time.  Skin: Skin is warm and dry.  Psychiatric: He has a normal mood and affect. His behavior is normal. Judgment and thought content normal. His mood appears not anxious. His speech is not rapid and/or pressured. He is not aggressive, is not hyperactive and not combative. Cognition and memory are normal. He does not express impulsivity. He does not exhibit a depressed mood. He expresses no homicidal and no suicidal ideation.    ED Course  Procedures (including critical care time)  Labs Reviewed - No data to display No results found.   1. Medication refill       MDM  The patient be restarted on his same medications.  He was instructed to call psychiatrist for followup as an outpatient for adjustment of his medications.  These are the same medications he was discharged home on.        Lyanne Co, MD 10/04/11 2340

## 2012-03-22 ENCOUNTER — Emergency Department (HOSPITAL_COMMUNITY): Payer: 59

## 2012-03-22 ENCOUNTER — Emergency Department (HOSPITAL_COMMUNITY)
Admission: EM | Admit: 2012-03-22 | Discharge: 2012-03-22 | Disposition: A | Discharge status: Home / Self Care | Payer: 59 | Attending: Emergency Medicine | Admitting: Emergency Medicine

## 2012-03-22 DIAGNOSIS — Y92410 Unspecified street and highway as the place of occurrence of the external cause: Secondary | ICD-10-CM | POA: Insufficient documentation

## 2012-03-22 DIAGNOSIS — Z8679 Personal history of other diseases of the circulatory system: Secondary | ICD-10-CM | POA: Insufficient documentation

## 2012-03-22 DIAGNOSIS — S30811A Abrasion of abdominal wall, initial encounter: Secondary | ICD-10-CM

## 2012-03-22 DIAGNOSIS — S139XXA Sprain of joints and ligaments of unspecified parts of neck, initial encounter: Secondary | ICD-10-CM | POA: Insufficient documentation

## 2012-03-22 DIAGNOSIS — Y9389 Activity, other specified: Secondary | ICD-10-CM | POA: Insufficient documentation

## 2012-03-22 DIAGNOSIS — F17200 Nicotine dependence, unspecified, uncomplicated: Secondary | ICD-10-CM | POA: Insufficient documentation

## 2012-03-22 DIAGNOSIS — S161XXA Strain of muscle, fascia and tendon at neck level, initial encounter: Secondary | ICD-10-CM

## 2012-03-22 DIAGNOSIS — M545 Low back pain: Secondary | ICD-10-CM

## 2012-03-22 DIAGNOSIS — IMO0002 Reserved for concepts with insufficient information to code with codable children: Secondary | ICD-10-CM | POA: Insufficient documentation

## 2012-03-22 DIAGNOSIS — S20219A Contusion of unspecified front wall of thorax, initial encounter: Secondary | ICD-10-CM

## 2012-03-22 DIAGNOSIS — M546 Pain in thoracic spine: Secondary | ICD-10-CM

## 2012-03-22 LAB — PROTIME-INR
INR: 0.88 (ref 0.00–1.49)
Prothrombin Time: 11.9 seconds (ref 11.6–15.2)

## 2012-03-22 LAB — COMPREHENSIVE METABOLIC PANEL
ALT: 18 U/L (ref 0–53)
CO2: 25 mEq/L (ref 19–32)
Calcium: 9.8 mg/dL (ref 8.4–10.5)
GFR calc Af Amer: >90 mL/min (ref >90)
GFR calc non Af Amer: >90 mL/min (ref >90)
Glucose, Bld: 92 mg/dL (ref 70–99)
Sodium: 142 mEq/L (ref 135–145)
Total Bilirubin: 0.2 mg/dL — ABNORMAL LOW (ref 0.3–1.2)

## 2012-03-22 LAB — ETHANOL: Alcohol, Ethyl (B): <11 mg/dL (ref 0–11)

## 2012-03-22 LAB — CBC WITH DIFFERENTIAL/PLATELET
Eosinophils Relative: 3 % (ref 0–5)
HCT: 43.9 % (ref 39.0–52.0)
Lymphocytes Relative: 19 % (ref 12–46)
Lymphs Abs: 2.2 10*3/uL (ref 0.7–4.0)
MCV: 95.4 fL (ref 78.0–100.0)
Monocytes Absolute: 0.6 10*3/uL (ref 0.1–1.0)
Platelets: 246 10*3/uL (ref 150–400)
RBC: 4.6 MIL/uL (ref 4.22–5.81)
WBC: 11.4 10*3/uL — ABNORMAL HIGH (ref 4.0–10.5)

## 2012-03-22 MED ORDER — MORPHINE SULFATE 4 MG/ML IJ SOLN
4.0000 mg | Freq: Once | INTRAMUSCULAR | Status: AC
  Administered 2012-03-22: 4 mg via INTRAVENOUS
  Filled 2012-03-22: qty 1

## 2012-03-22 MED ORDER — OXYCODONE-ACETAMINOPHEN 5-325 MG PO TABS: 2.0000 | ORAL_TABLET | ORAL | Status: DC | PRN

## 2012-03-22 MED ORDER — HYDROMORPHONE HCL PF 1 MG/ML IJ SOLN
1.0000 mg | Freq: Once | INTRAMUSCULAR | Status: AC
  Administered 2012-03-22: 1 mg via INTRAVENOUS
  Filled 2012-03-22: qty 1

## 2012-03-22 MED ORDER — IOHEXOL 300 MG/ML  SOLN: 100.0000 mL | Freq: Once | INTRAMUSCULAR | Status: DC | PRN

## 2012-03-22 MED ORDER — IBUPROFEN 600 MG PO TABS: 600.0000 mg | ORAL_TABLET | Freq: Four times a day (QID) | ORAL | Status: DC | PRN
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
## 2012-08-06 ENCOUNTER — Emergency Department (HOSPITAL_COMMUNITY): Payer: 59

## 2012-08-06 ENCOUNTER — Observation Stay (HOSPITAL_COMMUNITY)
Admission: EM | Admit: 2012-08-06 | Discharge: 2012-08-07 | Disposition: A | Discharge status: Home / Self Care | Payer: 59 | Attending: Orthopedic Surgery | Admitting: Orthopedic Surgery

## 2012-08-06 DIAGNOSIS — Y249XXA Unspecified firearm discharge, undetermined intent, initial encounter: Secondary | ICD-10-CM | POA: Insufficient documentation

## 2012-08-06 DIAGNOSIS — S61409A Unspecified open wound of unspecified hand, initial encounter: Principal | ICD-10-CM | POA: Insufficient documentation

## 2012-08-06 DIAGNOSIS — S62309B Unspecified fracture of unspecified metacarpal bone, initial encounter for open fracture: Secondary | ICD-10-CM | POA: Insufficient documentation

## 2012-08-06 DIAGNOSIS — W3400XA Accidental discharge from unspecified firearms or gun, initial encounter: Secondary | ICD-10-CM

## 2012-08-06 DIAGNOSIS — IMO0002 Reserved for concepts with insufficient information to code with codable children: Secondary | ICD-10-CM | POA: Insufficient documentation

## 2012-08-06 DIAGNOSIS — T79A19A Traumatic compartment syndrome of unspecified upper extremity, initial encounter: Secondary | ICD-10-CM | POA: Insufficient documentation

## 2012-08-06 DIAGNOSIS — S65509A Unspecified injury of blood vessel of unspecified finger, initial encounter: Secondary | ICD-10-CM | POA: Insufficient documentation

## 2012-08-06 LAB — COMPREHENSIVE METABOLIC PANEL
ALT: 15 U/L (ref 0–53)
Albumin: 3.8 g/dL (ref 3.5–5.2)
Alkaline Phosphatase: 96 U/L (ref 39–117)
Chloride: 102 mEq/L (ref 96–112)
GFR calc Af Amer: >90 mL/min (ref >90)
Glucose, Bld: 103 mg/dL — ABNORMAL HIGH (ref 70–99)
Potassium: 3.5 mEq/L (ref 3.5–5.1)
Sodium: 140 mEq/L (ref 135–145)
Total Bilirubin: 0.1 mg/dL — ABNORMAL LOW (ref 0.3–1.2)
Total Protein: 6.7 g/dL (ref 6.0–8.3)

## 2012-08-06 LAB — CBC WITH DIFFERENTIAL/PLATELET
Eosinophils Absolute: 0.4 10*3/uL (ref 0.0–0.7)
Hemoglobin: 13.9 g/dL (ref 13.0–17.0)
Lymphs Abs: 2.2 10*3/uL (ref 0.7–4.0)
MCH: 33.5 pg (ref 26.0–34.0)
Monocytes Relative: 7 % (ref 3–12)
Neutro Abs: 6.5 10*3/uL (ref 1.7–7.7)
Neutrophils Relative %: 66 % (ref 43–77)
Platelets: 256 10*3/uL (ref 150–400)
RBC: 4.15 MIL/uL — ABNORMAL LOW (ref 4.22–5.81)
WBC: 9.9 10*3/uL (ref 4.0–10.5)

## 2012-08-06 MED ORDER — CEFAZOLIN SODIUM 1 G IJ SOLR: 1.0000 g | Freq: Once | INTRAMUSCULAR | Status: DC

## 2012-08-06 MED ORDER — TETANUS-DIPHTH-ACELL PERTUSSIS 5-2.5-18.5 LF-MCG/0.5 IM SUSP
0.5000 mL | Freq: Once | INTRAMUSCULAR | Status: DC
Start: 2012-08-06 — End: 2012-08-07
  Filled 2012-08-06: qty 0.5

## 2012-08-06 MED ORDER — CEFAZOLIN SODIUM 1-5 GM-% IV SOLN
1.0000 g | Freq: Once | INTRAVENOUS | Status: AC
  Administered 2012-08-06: 1 g via INTRAVENOUS
  Filled 2012-08-06: qty 50

## 2012-08-06 MED ORDER — FENTANYL CITRATE 0.05 MG/ML IJ SOLN
50.0000 ug | Freq: Once | INTRAMUSCULAR | Status: AC
  Administered 2012-08-06: 50 ug via INTRAVENOUS
  Filled 2012-08-06: qty 2

## 2012-08-06 NOTE — ED Notes (Signed)
Pt to XR
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
## 2012-08-06 NOTE — ED Notes (Signed)
[employer] CSI at bedside. Pt gown changed and removed from monitors for XR.
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
## 2012-08-06 NOTE — ED Notes (Signed)
Pt returned from XR. [employer] at bedside.
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
## 2012-08-06 NOTE — ED Notes (Signed)
Pt continues to remove bandages and dressings from hand.

## 2012-08-07 ENCOUNTER — Emergency Department (HOSPITAL_COMMUNITY): Payer: 59 | Admitting: Anesthesiology

## 2012-08-07 ENCOUNTER — Encounter (HOSPITAL_COMMUNITY): Payer: Self-pay | Admitting: Anesthesiology

## 2012-08-07 ENCOUNTER — Encounter (HOSPITAL_COMMUNITY)
Admission: EM | Disposition: A | Discharge status: Home / Self Care | Payer: Self-pay | Source: Home / Self Care | Attending: Emergency Medicine

## 2012-08-07 HISTORY — PX: NERVE AND TENDON REPAIR: SHX5693

## 2012-08-07 HISTORY — PX: I&D EXTREMITY: SHX5045

## 2012-08-07 HISTORY — PX: OPEN REDUCTION INTERNAL FIXATION (ORIF) HAND: SHX5991

## 2012-08-07 SURGERY — IRRIGATION AND DEBRIDEMENT EXTREMITY
Anesthesia: General | Laterality: Left

## 2012-08-07 MED ORDER — ONDANSETRON HCL 4 MG/2ML IJ SOLN
INTRAMUSCULAR | Status: DC | PRN
  Administered 2012-08-07: 4 mg via INTRAVENOUS

## 2012-08-07 MED ORDER — DEXTROSE 5 % IV SOLN
INTRAVENOUS | Status: DC | PRN
  Administered 2012-08-07: 02:00:00 via INTRAVENOUS

## 2012-08-07 MED ORDER — NICOTINE 7 MG/24HR TD PT24
7.0000 mg | MEDICATED_PATCH | Freq: Once | TRANSDERMAL | Status: DC
  Administered 2012-08-07: 7 mg via TRANSDERMAL
  Filled 2012-08-07 (×2): qty 1

## 2012-08-07 MED ORDER — OXYCODONE HCL 5 MG PO TABS
5.0000 mg | ORAL_TABLET | ORAL | Status: DC | PRN
  Administered 2012-08-07: 5 mg via ORAL
  Filled 2012-08-07: qty 2

## 2012-08-07 MED ORDER — ONDANSETRON HCL 4 MG/2ML IJ SOLN: 4.0000 mg | Freq: Four times a day (QID) | INTRAMUSCULAR | Status: DC | PRN

## 2012-08-07 MED ORDER — OXYCODONE HCL 5 MG PO TABS
5.0000 mg | ORAL_TABLET | Freq: Once | ORAL | Status: AC | PRN
  Administered 2012-08-07: 5 mg via ORAL

## 2012-08-07 MED ORDER — CEFAZOLIN SODIUM 1-5 GM-% IV SOLN
INTRAVENOUS | Status: DC | PRN
  Administered 2012-08-07: 1 g via INTRAVENOUS

## 2012-08-07 MED ORDER — METHOCARBAMOL 100 MG/ML IJ SOLN: 500.0000 mg | Freq: Four times a day (QID) | INTRAVENOUS | Status: DC | PRN

## 2012-08-07 MED ORDER — LIDOCAINE HCL 2 % IJ SOLN: INTRAMUSCULAR | Status: DC | PRN

## 2012-08-07 MED ORDER — CEFAZOLIN SODIUM 1-5 GM-% IV SOLN
1.0000 g | INTRAVENOUS | Status: DC
  Filled 2012-08-07: qty 50

## 2012-08-07 MED ORDER — PROPOFOL 10 MG/ML IV BOLUS
INTRAVENOUS | Status: DC | PRN
  Administered 2012-08-07: 200 mg via INTRAVENOUS

## 2012-08-07 MED ORDER — MORPHINE SULFATE 2 MG/ML IJ SOLN
1.0000 mg | INTRAMUSCULAR | Status: DC | PRN
  Administered 2012-08-07: 1 mg via INTRAVENOUS
  Filled 2012-08-07: qty 1

## 2012-08-07 MED ORDER — CEPHALEXIN 500 MG PO CAPS: 500.0000 mg | ORAL_CAPSULE | Freq: Four times a day (QID) | ORAL | Status: DC

## 2012-08-07 MED ORDER — ALPRAZOLAM 0.5 MG PO TABS
0.5000 mg | ORAL_TABLET | Freq: Four times a day (QID) | ORAL | Status: DC | PRN
  Administered 2012-08-07: 0.5 mg via ORAL
  Filled 2012-08-07: qty 1

## 2012-08-07 MED ORDER — HYDROMORPHONE HCL PF 1 MG/ML IJ SOLN
INTRAMUSCULAR | Status: AC
  Filled 2012-08-07: qty 1

## 2012-08-07 MED ORDER — FENTANYL CITRATE 0.05 MG/ML IJ SOLN
INTRAMUSCULAR | Status: DC | PRN
  Administered 2012-08-07: 100 ug via INTRAVENOUS
  Administered 2012-08-07: 150 ug via INTRAVENOUS

## 2012-08-07 MED ORDER — OXYCODONE HCL 5 MG/5ML PO SOLN: 5.0000 mg | Freq: Once | ORAL | Status: AC | PRN

## 2012-08-07 MED ORDER — FAMOTIDINE 20 MG PO TABS
20.0000 mg | ORAL_TABLET | Freq: Two times a day (BID) | ORAL | Status: DC | PRN
  Filled 2012-08-07: qty 1

## 2012-08-07 MED ORDER — METOCLOPRAMIDE HCL 5 MG/ML IJ SOLN: 10.0000 mg | Freq: Once | INTRAMUSCULAR | Status: DC | PRN

## 2012-08-07 MED ORDER — OXYCODONE HCL 5 MG PO TABS: 5.0000 mg | ORAL_TABLET | ORAL | Status: DC | PRN

## 2012-08-07 MED ORDER — PROMETHAZINE HCL 25 MG RE SUPP: 12.5000 mg | Freq: Four times a day (QID) | RECTAL | Status: DC | PRN

## 2012-08-07 MED ORDER — LACTATED RINGERS IV SOLN: INTRAVENOUS | Status: DC

## 2012-08-07 MED ORDER — CEFAZOLIN SODIUM 1-5 GM-% IV SOLN
1.0000 g | Freq: Three times a day (TID) | INTRAVENOUS | Status: DC
  Administered 2012-08-07: 1 g via INTRAVENOUS
  Filled 2012-08-07 (×2): qty 50

## 2012-08-07 MED ORDER — DOCUSATE SODIUM 100 MG PO CAPS
100.0000 mg | ORAL_CAPSULE | Freq: Two times a day (BID) | ORAL | Status: DC
  Administered 2012-08-07: 100 mg via ORAL

## 2012-08-07 MED ORDER — VITAMIN C 500 MG PO TABS
1000.0000 mg | ORAL_TABLET | Freq: Every day | ORAL | Status: DC
  Administered 2012-08-07: 1000 mg via ORAL
  Filled 2012-08-07: qty 2

## 2012-08-07 MED ORDER — OXYCODONE HCL 5 MG PO TABS
ORAL_TABLET | ORAL | Status: AC
  Filled 2012-08-07: qty 1

## 2012-08-07 MED ORDER — LIDOCAINE HCL (CARDIAC) 20 MG/ML IV SOLN
INTRAVENOUS | Status: DC | PRN
  Administered 2012-08-07: 100 mg via INTRAVENOUS

## 2012-08-07 MED ORDER — LIDOCAINE HCL 2 % IJ SOLN
INTRAMUSCULAR | Status: AC
  Filled 2012-08-07: qty 20

## 2012-08-07 MED ORDER — LACTATED RINGERS IV SOLN
INTRAVENOUS | Status: DC | PRN
  Administered 2012-08-07 (×2): via INTRAVENOUS

## 2012-08-07 MED ORDER — ONDANSETRON HCL 4 MG PO TABS: 4.0000 mg | ORAL_TABLET | Freq: Four times a day (QID) | ORAL | Status: DC | PRN

## 2012-08-07 MED ORDER — MIDAZOLAM HCL 5 MG/5ML IJ SOLN
INTRAMUSCULAR | Status: DC | PRN
  Administered 2012-08-07: 2 mg via INTRAVENOUS

## 2012-08-07 MED ORDER — METHOCARBAMOL 500 MG PO TABS
500.0000 mg | ORAL_TABLET | Freq: Four times a day (QID) | ORAL | Status: DC | PRN
  Administered 2012-08-07: 500 mg via ORAL
  Filled 2012-08-07: qty 1

## 2012-08-07 MED ORDER — DEXAMETHASONE SODIUM PHOSPHATE 4 MG/ML IJ SOLN
INTRAMUSCULAR | Status: DC | PRN
  Administered 2012-08-07: 4 mg via INTRAVENOUS

## 2012-08-07 MED ORDER — SUCCINYLCHOLINE CHLORIDE 20 MG/ML IJ SOLN
INTRAMUSCULAR | Status: DC | PRN
  Administered 2012-08-07: 120 mg via INTRAVENOUS

## 2012-08-07 MED ORDER — HYDROMORPHONE HCL PF 1 MG/ML IJ SOLN: 1.0000 mg | INTRAMUSCULAR | Status: DC | PRN

## 2012-08-07 MED ORDER — HYDROMORPHONE HCL PF 1 MG/ML IJ SOLN
0.2500 mg | INTRAMUSCULAR | Status: DC | PRN
  Administered 2012-08-07 (×4): 0.5 mg via INTRAVENOUS

## 2012-08-07 MED ORDER — SODIUM CHLORIDE 0.9 % IR SOLN
Status: DC | PRN
  Administered 2012-08-07: 1

## 2012-08-07 SURGICAL SUPPLY — 45 items
BANDAGE CONFORM 2  STR LF (GAUZE/BANDAGES/DRESSINGS) IMPLANT
BANDAGE ELASTIC 4 VELCRO ST LF (GAUZE/BANDAGES/DRESSINGS) ×4 IMPLANT
BANDAGE GAUZE ELAST BULKY 4 IN (GAUZE/BANDAGES/DRESSINGS) ×2 IMPLANT
CANISTER SUCTION 2500CC (MISCELLANEOUS) ×4 IMPLANT
CLOTH BEACON ORANGE TIMEOUT ST (SAFETY) ×2 IMPLANT
CORDS BIPOLAR (ELECTRODE) ×2 IMPLANT
CUFF TOURNIQUET SINGLE 18IN (TOURNIQUET CUFF) ×2 IMPLANT
DRSG ADAPTIC 3X8 NADH LF (GAUZE/BANDAGES/DRESSINGS) ×2 IMPLANT
ELECT REM PT RETURN 9FT ADLT (ELECTROSURGICAL)
ELECTRODE REM PT RTRN 9FT ADLT (ELECTROSURGICAL) IMPLANT
GAUZE XEROFORM 1X8 LF (GAUZE/BANDAGES/DRESSINGS) ×2 IMPLANT
GAUZE XEROFORM 5X9 LF (GAUZE/BANDAGES/DRESSINGS) ×2 IMPLANT
GLOVE BIO SURGEON ST LM GN SZ9 (GLOVE) ×4 IMPLANT
GLOVE BIOGEL M STRL SZ7.5 (GLOVE) ×4 IMPLANT
GLOVE SS BIOGEL STRL SZ 8 (GLOVE) ×1 IMPLANT
GLOVE SUPERSENSE BIOGEL SZ 8 (GLOVE) ×1
GOWN PREVENTION PLUS XLARGE (GOWN DISPOSABLE) ×2 IMPLANT
GOWN STRL NON-REIN LRG LVL3 (GOWN DISPOSABLE) ×6 IMPLANT
GOWN STRL REIN XL XLG (GOWN DISPOSABLE) ×4 IMPLANT
HANDPIECE INTERPULSE COAX TIP (DISPOSABLE)
KIT BASIN OR (CUSTOM PROCEDURE TRAY) ×2 IMPLANT
KIT ROOM TURNOVER OR (KITS) ×2 IMPLANT
LOOP VESSEL SUPERMAXI WHITE (MISCELLANEOUS) ×2 IMPLANT
NEEDLE HYPO 25GX1X1/2 BEV (NEEDLE) ×2 IMPLANT
NS IRRIG 1000ML POUR BTL (IV SOLUTION) ×2 IMPLANT
PACK ORTHO EXTREMITY (CUSTOM PROCEDURE TRAY) ×2 IMPLANT
PAD ARMBOARD 7.5X6 YLW CONV (MISCELLANEOUS) ×4 IMPLANT
PAD CAST 4YDX4 CTTN HI CHSV (CAST SUPPLIES) ×1 IMPLANT
PADDING CAST COTTON 4X4 STRL (CAST SUPPLIES) ×1
SET HNDPC FAN SPRY TIP SCT (DISPOSABLE) IMPLANT
SPEAR EYE SURG WECK-CEL (MISCELLANEOUS) ×2 IMPLANT
SPLINT FIBERGLASS 4X30 (CAST SUPPLIES) ×2 IMPLANT
SPONGE GAUZE 4X4 12PLY (GAUZE/BANDAGES/DRESSINGS) ×2 IMPLANT
SPONGE LAP 18X18 X RAY DECT (DISPOSABLE) ×2 IMPLANT
SPONGE LAP 4X18 X RAY DECT (DISPOSABLE) ×2 IMPLANT
SUT CHROMIC 4 0 P 3 18 (SUTURE) ×2 IMPLANT
SUT ETHILON 9 0 BV130 4 (SUTURE) ×4 IMPLANT
SUT PROLENE 4 0 PS 2 18 (SUTURE) ×6 IMPLANT
SYR CONTROL 10ML LL (SYRINGE) ×2 IMPLANT
TOWEL OR 17X24 6PK STRL BLUE (TOWEL DISPOSABLE) ×2 IMPLANT
TOWEL OR 17X26 10 PK STRL BLUE (TOWEL DISPOSABLE) ×2 IMPLANT
TUBE ANAEROBIC SPECIMEN COL (MISCELLANEOUS) IMPLANT
TUBE CONNECTING 12X1/4 (SUCTIONS) ×2 IMPLANT
WATER STERILE IRR 1000ML POUR (IV SOLUTION) ×2 IMPLANT
YANKAUER SUCT BULB TIP NO VENT (SUCTIONS) ×2 IMPLANT
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
## 2012-08-07 NOTE — ED Notes (Signed)
Pt to OR with RN and EMT. Bedside report given.

## 2012-08-07 NOTE — Preoperative (Signed)
Beta Blockers   Reason not to administer Beta Blockers:Not Applicable 

## 2012-08-07 NOTE — Op Note (Signed)
Please see dictation #[id number]  Dominica Severin M.D.

## 2012-08-07 NOTE — Anesthesia Preprocedure Evaluation (Addendum)
Anesthesia Evaluation  Patient identified by MRN, date of birth, ID band Patient awake    Reviewed: Allergy & Precautions, H&P , NPO status , Patient's Chart, lab work & pertinent test results, reviewed documented beta blocker date and time   History of Anesthesia Complications Negative for: history of anesthetic complications  Airway Mallampati: I TM Distance: >3 FB Neck ROM: Full    Dental  (+) Teeth Intact and Dental Advisory Given   Pulmonary Current Smoker,  breath sounds clear to auscultation        Cardiovascular negative cardio ROS  Rhythm:regular     Neuro/Psych  Headaches,    GI/Hepatic negative GI ROS, (+)     substance abuse  alcohol use and marijuana use,   Endo/Other  negative endocrine ROS  Renal/GU negative Renal ROS  negative genitourinary   Musculoskeletal  (+) Arthritis -, Osteoarthritis,    Abdominal   Peds  Hematology negative hematology ROS (+)   Anesthesia Other Findings See surgeon's H&P   Reproductive/Obstetrics negative OB ROS                         Anesthesia Physical Anesthesia Plan  ASA: II and emergent  Anesthesia Plan: General   Post-op Pain Management:    Induction: Intravenous, Rapid sequence and Cricoid pressure planned  Airway Management Planned: Oral ETT  Additional Equipment:   Intra-op Plan:   Post-operative Plan: Extubation in OR  Informed Consent: I have reviewed the patients History and Physical, chart, labs and discussed the procedure including the risks, benefits and alternatives for the proposed anesthesia with the patient or authorized representative who has indicated his/her understanding and acceptance.   Dental advisory given  Plan Discussed with: CRNA, Anesthesiologist and Surgeon  Anesthesia Plan Comments:        Anesthesia Quick Evaluation

## 2012-08-07 NOTE — Anesthesia Postprocedure Evaluation (Signed)
Anesthesia Post Note  Patient: Taylor Rowe  Procedure(s) Performed: Procedure(s) (LRB): IRRIGATION AND DEBRIDEMENT EXTREMITY (Left) NERVE AND TENDON REPAIR (Left) OPEN REDUCTION INTERNAL FIXATION (ORIF) HAND (Left)  Anesthesia type: General  Patient location: PACU  Post pain: Pain level controlled  Post assessment: Patient's Cardiovascular Status Stable  Last Vitals:  Filed Vitals:   08/07/12 0418  BP: 148/90  Pulse: 104  Temp: 36.4 C  Resp: 18    Post vital signs: Reviewed and stable  Level of consciousness: alert  Complications: No apparent anesthesia complications
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
## 2012-08-07 NOTE — Anesthesia Procedure Notes (Signed)
Procedure Name: Intubation Date/Time: 08/07/2012 1:39 AM Performed by: Aleshka Corney S Pre-anesthesia Checklist: Patient identified, Timeout performed, Emergency Drugs available, Suction available and Patient being monitored Patient Re-evaluated:Patient Re-evaluated prior to inductionOxygen Delivery Method: Circle system utilized Preoxygenation: Pre-oxygenation with 100% oxygen Intubation Type: IV induction, Rapid sequence and Cricoid Pressure applied Ventilation: Mask ventilation without difficulty Laryngoscope Size: Mac and 4 Grade View: Grade I Tube type: Oral Tube size: 8.0 mm Number of attempts: 1 Airway Equipment and Method: Stylet Placement Confirmation: ETT inserted through vocal cords under direct vision,  positive ETCO2 and breath sounds checked- equal and bilateral Secured at: 23 cm Tube secured with: Tape Dental Injury: Teeth and Oropharynx as per pre-operative assessment
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
## 2012-08-07 NOTE — Transfer of Care (Signed)
Immediate Anesthesia Transfer of Care Note  Patient: Taylor Rowe  Procedure(s) Performed: Procedure(s): IRRIGATION AND DEBRIDEMENT EXTREMITY (Left) NERVE AND TENDON REPAIR (Left) OPEN REDUCTION INTERNAL FIXATION (ORIF) HAND (Left)  Patient Location: PACU  Anesthesia Type:General  Level of Consciousness: awake, alert  and oriented  Airway & Oxygen Therapy: Patient Spontanous Breathing and Patient connected to nasal cannula oxygen  Post-op Assessment: Report given to PACU RN and Post -op Vital signs reviewed and stable  Post vital signs: Reviewed and stable  Complications: No apparent anesthesia complications
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
## 2012-08-07 NOTE — ED Notes (Signed)
Pt reports numbness to 3rd and 4th fingers left hand.
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
## 2012-08-07 NOTE — ED Notes (Signed)
Dr. Graming at bedside 

## 2012-08-08 ENCOUNTER — Encounter (HOSPITAL_COMMUNITY): Payer: Self-pay | Admitting: Orthopedic Surgery

## 2013-04-27 ENCOUNTER — Encounter (HOSPITAL_COMMUNITY): Payer: Self-pay | Admitting: Emergency Medicine

## 2013-04-27 ENCOUNTER — Emergency Department (HOSPITAL_COMMUNITY)
Admission: EM | Admit: 2013-04-27 | Discharge: 2013-04-27 | Disposition: A | Discharge status: Home / Self Care | Payer: 59 | Attending: Emergency Medicine | Admitting: Emergency Medicine

## 2013-04-27 ENCOUNTER — Emergency Department (HOSPITAL_COMMUNITY): Payer: 59

## 2013-04-27 DIAGNOSIS — F17200 Nicotine dependence, unspecified, uncomplicated: Secondary | ICD-10-CM | POA: Insufficient documentation

## 2013-04-27 DIAGNOSIS — Z792 Long term (current) use of antibiotics: Secondary | ICD-10-CM | POA: Insufficient documentation

## 2013-04-27 DIAGNOSIS — R0602 Shortness of breath: Secondary | ICD-10-CM | POA: Insufficient documentation

## 2013-04-27 DIAGNOSIS — Z8679 Personal history of other diseases of the circulatory system: Secondary | ICD-10-CM | POA: Insufficient documentation

## 2013-04-27 DIAGNOSIS — IMO0001 Reserved for inherently not codable concepts without codable children: Secondary | ICD-10-CM | POA: Insufficient documentation

## 2013-04-27 DIAGNOSIS — IMO0002 Reserved for concepts with insufficient information to code with codable children: Secondary | ICD-10-CM | POA: Insufficient documentation

## 2013-04-27 DIAGNOSIS — J189 Pneumonia, unspecified organism: Secondary | ICD-10-CM

## 2013-04-27 DIAGNOSIS — Z79899 Other long term (current) drug therapy: Secondary | ICD-10-CM | POA: Insufficient documentation

## 2013-04-27 DIAGNOSIS — J159 Unspecified bacterial pneumonia: Secondary | ICD-10-CM | POA: Insufficient documentation

## 2013-04-27 DIAGNOSIS — R110 Nausea: Secondary | ICD-10-CM | POA: Insufficient documentation

## 2013-04-27 LAB — COMPREHENSIVE METABOLIC PANEL
ALBUMIN: 4.2 g/dL (ref 3.5–5.2)
ALK PHOS: 96 U/L (ref 39–117)
ALT: 16 U/L (ref 0–53)
AST: 20 U/L (ref 0–37)
BUN: 13 mg/dL (ref 6–23)
CALCIUM: 9.3 mg/dL (ref 8.4–10.5)
CO2: 25 mEq/L (ref 19–32)
Chloride: 99 mEq/L (ref 96–112)
Creatinine, Ser: 0.82 mg/dL (ref 0.50–1.35)
GFR calc non Af Amer: >90 mL/min (ref >90)
GLUCOSE: 121 mg/dL — AB (ref 70–99)
POTASSIUM: 4.3 meq/L (ref 3.7–5.3)
SODIUM: 140 meq/L (ref 137–147)
TOTAL PROTEIN: 7.5 g/dL (ref 6.0–8.3)
Total Bilirubin: 0.4 mg/dL (ref 0.3–1.2)

## 2013-04-27 LAB — CBC
HCT: 42.2 % (ref 39.0–52.0)
HEMOGLOBIN: 14.9 g/dL (ref 13.0–17.0)
MCH: 33.4 pg (ref 26.0–34.0)
MCHC: 35.3 g/dL (ref 30.0–36.0)
MCV: 94.6 fL (ref 78.0–100.0)
PLATELETS: 224 10*3/uL (ref 150–400)
RBC: 4.46 MIL/uL (ref 4.22–5.81)
RDW: 12.8 % (ref 11.5–15.5)
WBC: 14 10*3/uL — ABNORMAL HIGH (ref 4.0–10.5)

## 2013-04-27 LAB — POCT I-STAT TROPONIN I: Troponin i, poc: 0 ng/mL (ref 0.00–0.08)

## 2013-04-27 MED ORDER — HYDROCODONE-HOMATROPINE 5-1.5 MG/5ML PO SYRP: 5.0000 mL | ORAL_SOLUTION | Freq: Four times a day (QID) | ORAL | Status: DC | PRN

## 2013-04-27 MED ORDER — PREDNISONE 20 MG PO TABS
60.0000 mg | ORAL_TABLET | Freq: Once | ORAL | Status: AC
  Administered 2013-04-27: 60 mg via ORAL
  Filled 2013-04-27: qty 3

## 2013-04-27 MED ORDER — AZITHROMYCIN 250 MG PO TABS: 250.0000 mg | ORAL_TABLET | Freq: Every day | ORAL | Status: DC

## 2013-04-27 MED ORDER — ALBUTEROL SULFATE HFA 108 (90 BASE) MCG/ACT IN AERS: 2.0000 | INHALATION_SPRAY | Freq: Four times a day (QID) | RESPIRATORY_TRACT | Status: AC | PRN

## 2013-04-27 MED ORDER — ALBUTEROL SULFATE HFA 108 (90 BASE) MCG/ACT IN AERS
2.0000 | INHALATION_SPRAY | Freq: Once | RESPIRATORY_TRACT | Status: AC
  Administered 2013-04-27: 2 via RESPIRATORY_TRACT
  Filled 2013-04-27: qty 6.7

## 2013-04-27 MED ORDER — PREDNISONE 20 MG PO TABS: 60.0000 mg | ORAL_TABLET | Freq: Every day | ORAL | Status: DC

## 2013-04-27 NOTE — Discharge Instructions (Signed)
Please follow up with your primary care physician in 1-2 days. If you do not have one please call the [facility] number listed above. Please have a repeat x-ray in one week. Please take your antibiotic until completion. Please take medications as prescribed. Please read all discharge instructions and return precautions.   Pneumonia, Adult Pneumonia is an infection of the lungs.  CAUSES Pneumonia may be caused by bacteria or a virus. Usually, these infections are caused by breathing infectious particles into the lungs (respiratory tract). SYMPTOMS   Cough.  Fever.  Chest pain.  Increased rate of breathing.  Wheezing.  Mucus production. DIAGNOSIS  If you have the common symptoms of pneumonia, your caregiver will typically confirm the diagnosis with a chest X-ray. The X-ray will show an abnormality in the lung (pulmonary infiltrate) if you have pneumonia. Other tests of your blood, urine, or sputum may be done to find the specific cause of your pneumonia. Your caregiver may also do tests (blood gases or pulse oximetry) to see how well your lungs are working. TREATMENT  Some forms of pneumonia may be spread to other people when you cough or sneeze. You may be asked to wear a mask before and during your exam. Pneumonia that is caused by bacteria is treated with antibiotic medicine. Pneumonia that is caused by the influenza virus may be treated with an antiviral medicine. Most other viral infections must run their course. These infections will not respond to antibiotics.  PREVENTION A pneumococcal shot (vaccine) is available to prevent a common bacterial cause of pneumonia. This is usually suggested for:  People over 24 years old.  Patients on chemotherapy.  People with chronic lung problems, such as bronchitis or emphysema.  People with immune system problems. If you are over 65 or have a high risk condition, you may receive the pneumococcal vaccine if you have not  received it before. In some countries, a routine influenza vaccine is also recommended. This vaccine can help prevent some cases of pneumonia.You may be offered the influenza vaccine as part of your care. If you smoke, it is time to quit. You may receive instructions on how to stop smoking. Your caregiver can provide medicines and counseling to help you quit. HOME CARE INSTRUCTIONS   Cough suppressants may be used if you are losing too much rest. However, coughing protects you by clearing your lungs. You should avoid using cough suppressants if you can.  Your caregiver may have prescribed medicine if he or she thinks your pneumonia is caused by a bacteria or influenza. Finish your medicine even if you start to feel better.  Your caregiver may also prescribe an expectorant. This loosens the mucus to be coughed up.  Only take over-the-counter or prescription medicines for pain, discomfort, or fever as directed by your caregiver.  Do not smoke. Smoking is a common cause of bronchitis and can contribute to pneumonia. If you are a smoker and continue to smoke, your cough may last several weeks after your pneumonia has cleared.  A cold steam vaporizer or humidifier in your room or home may help loosen mucus.  Coughing is often worse at night. Sleeping in a semi-upright position in a recliner or using a couple pillows under your head will help with this.  Get rest as you feel it is needed. Your body will usually let you know when you need to rest. SEEK IMMEDIATE MEDICAL CARE IF:   Your illness becomes worse. This is especially true if  you are elderly or weakened from any other disease.  You cannot control your cough with suppressants and are losing sleep.  You begin coughing up blood.  You develop pain which is getting worse or is uncontrolled with medicines.  You have a fever.  Any of the symptoms which initially brought you in for treatment are getting worse rather than better.  You  develop shortness of breath or chest pain. MAKE SURE YOU:   Understand these instructions.  Will watch your condition.  Will get help right away if you are not doing well or get worse. Document Released: 03/16/2005 Document Revised: 06/08/2011 Document Reviewed: 06/05/2010 [facility] Patient Information 2014 [employer] [place], [place].

## 2013-04-27 NOTE — ED Notes (Signed)
Pt reports chest pain since Tuesday, [facility] States pain generalized across chest with shortness of breath, dizziness and nausea.

## 2013-04-27 NOTE — ED Notes (Signed)
Pt discharged home, alert and ambulatory upon discharge, 4 new rx prescribed, pt verbalizes understanding of discharge instructions, pt drove self home, no narcotics given in ED

## 2013-04-27 NOTE — ED Notes (Signed)
EDPA Piepenbrink at bedside. 

## 2013-04-27 NOTE — ED Notes (Signed)
Pt reports generalized chest pain radiating across bilateral shoulders, and SOB. Unable to get comfortable, nothing improves his pain or makes it worse. Pt denies cough, congestion, fever, or chills

## 2013-04-27 NOTE — ED Provider Notes (Signed)
CSN: [id number]     Arrival date & time 04/27/13  1242 History   First MD Initiated Contact with Patient 04/27/13 1411     Chief Complaint  Patient presents with  . Chest Pain   (Consider location/radiation/quality/duration/timing/severity/associated sxs/prior Treatment) HPI Comments: Patient is a 33 year old male past medical history significant for migraines, tobacco use presented to the emergency department for diffuse chest tightness with associated rhinorrhea, nausea, myalgias, subjective chills. Patient states he gets occasionally dyspneic with exertion. He states the symptoms have been gradually worsening since then. He states [facility] and "they did not see anything wrong with him." Denies any fevers, cough (although is coughing during interview), abdominal pain. PERC negative. No early familial cardiac history.   Patient is a 33 y.o. male presenting with chest pain.  Chest Pain Associated symptoms: nausea and shortness of breath   Associated symptoms: no abdominal pain, no cough, no fever and not vomiting     Past Medical History  Diagnosis Date  . Migraines    Past Surgical History  Procedure Laterality Date  . Eye surgery    . Appendectomy    . I&d extremity Left 08/07/2012    Procedure: IRRIGATION AND DEBRIDEMENT EXTREMITY;  Surgeon: Dominica SeverinWilliam Gramig, MD;  [facility];  Service: Orthopedics;  Laterality: Left;  . Nerve and tendon repair Left 08/07/2012    Procedure: NERVE AND TENDON REPAIR;  Surgeon: Dominica SeverinWilliam Gramig, MD;  Location: [facility] OR;  Service: Orthopedics;  Laterality: Left;  . Open reduction internal fixation (orif) hand Left 08/07/2012    Procedure: OPEN REDUCTION INTERNAL FIXATION (ORIF) HAND;  Surgeon: Dominica SeverinWilliam Gramig, MD;  Location: [facility] OR;  Service: Orthopedics;  Laterality: Left;   No family history on file. History  Substance Use Topics  . Smoking status: Current Every Day Smoker -- 1.00 packs/day  . Smokeless  tobacco: Not on file  . Alcohol Use: 1.2 oz/week    2 Cans of beer per week     Comment: most days    Review of Systems  Constitutional: Negative for fever and chills.  Respiratory: Positive for chest tightness and shortness of breath. Negative for cough.   Cardiovascular: Positive for chest pain.  Gastrointestinal: Positive for nausea. Negative for vomiting and abdominal pain.  Musculoskeletal: Positive for myalgias.  All other systems reviewed and are negative.    Allergies  Other and Anesthetics, amide  Home Medications   Current Outpatient Rx  Name  Route  Sig  Dispense  Refill  . Aspirin-Acetaminophen-Caffeine (GOODY HEADACHE PO)   Oral   Take 2 packets by mouth daily as needed (for pain).         Marland Kitchen. albuterol (PROVENTIL HFA;VENTOLIN HFA) 108 (90 BASE) MCG/ACT inhaler   Inhalation   Inhale 2 puffs into the lungs every 6 (six) hours as needed for wheezing or shortness of breath.   1 Inhaler   1   . azithromycin (ZITHROMAX Z-PAK) 250 MG tablet   Oral   Take 1 tablet (250 mg total) by mouth daily. 500mg  PO day 1, then 250mg  PO days 205   6 tablet   0   . HYDROcodone-homatropine (HYCODAN) 5-1.5 MG/5ML syrup   Oral   Take 5 mLs by mouth every 6 (six) hours as needed for cough.   120 mL   0   . predniSONE (DELTASONE) 20 MG tablet   Oral   Take 3 tablets (60 mg total) by mouth daily.   15  tablet   0    BP 116/83  Pulse 84  Temp(Src) 97.5 F (36.4 C) (Oral)  Resp 18  Ht 5\' 7"  (1.702 m)  Wt 169 lb 12.8 oz (77.021 kg)  BMI 26.59 kg/m2  SpO2 100% Physical Exam  Constitutional: He is oriented to person, place, and time. He appears well-developed and well-nourished. No distress.  HENT:  Head: Normocephalic and atraumatic.  Right Ear: External ear normal.  Left Ear: External ear normal.  Nose: Nose normal.  Mouth/Throat: Oropharynx is clear and moist. No oropharyngeal exudate.  Eyes: Conjunctivae and EOM are normal. Pupils are equal, round, and reactive to  light.  Neck: Neck supple.  Cardiovascular: Normal rate, regular rhythm and normal heart sounds.   Pulmonary/Chest: Effort normal and breath sounds normal. No respiratory distress. He exhibits tenderness.  Dry cough appreciated on examination. Talking in complete sentences without difficulty.   Abdominal: Soft. There is no tenderness.  Musculoskeletal: Normal range of motion.  Lymphadenopathy:    He has no cervical adenopathy.  Neurological: He is alert and oriented to person, place, and time.  Skin: Skin is warm and dry. He is not diaphoretic.    ED Course  Procedures (including critical care time) Medications  albuterol (PROVENTIL HFA;VENTOLIN HFA) 108 (90 BASE) MCG/ACT inhaler 2 puff (2 puffs Inhalation Given 04/27/13 1444)  predniSONE (DELTASONE) tablet 60 mg (60 mg Oral Given 04/27/13 1443)    Labs Review Labs Reviewed  CBC - Abnormal; Notable for the following:    WBC 14.0 (*)    All other components within normal limits  COMPREHENSIVE METABOLIC PANEL - Abnormal; Notable for the following:    Glucose, Bld 121 (*)    All other components within normal limits  POCT I-STAT TROPONIN I   Imaging Review Dg Chest 2 View  04/27/2013   CLINICAL DATA:  Chest and arm and back pain for 4 days  EXAM: CHEST  2 VIEW  COMPARISON:  Chest x-ray dated March 22, 2012.  FINDINGS: The lungs are adequately inflated and exhibit coarse lung markings posteriorly and inferiorly on the lateral film likely on the left. The cardiac silhouette is top-normal in size. The pulmonary vascularity is not engorged. The mediastinum is normal in width. There is no pleural effusion or pneumothorax. The observed portions of the bony thorax appear normal.  IMPRESSION: Increased density in the retrocardiac region on the left may reflect atelectasis or pneumonia. Elsewhere there is no evidence of alveolar pneumonia. There is no evidence of CHF. Followup films following therapy are recommended to assure clearing.    Electronically Signed   By: David  [place]   On: 04/27/2013 13:44    EKG Interpretation    Date/Time:  Thursday April 27 2013 12:45:17 EST Ventricular Rate:  94 PR Interval:  154 QRS Duration: 90 QT Interval:  366 QTC Calculation: 457 R Axis:   77 Text Interpretation:  Normal sinus rhythm Normal ECG No old tracing to compare Confirmed by [facility]  MD, CHARLES [id number] on 04/27/2013 12:48:38 PM            MDM   1. Community acquired pneumonia     Filed Vitals:   04/27/13 1445  BP: 116/83  Pulse: 84  Temp:   Resp: 18   Afebrile, NAD, non-toxic appearing, AAOx4.  Patient has been diagnosed with CAP via chest xray. Pt is not ill appearing, immunocompromised, and does not have multiple co morbidities, therefore I feel like the they can be treated as an OP with  abx therapy. Pt has been advised to return to the ED if symptoms worsen or they do not improve. Pt verbalizes understanding and is agreeable with plan.      Jeannetta Ellis, PA-C 04/27/13 1510

## 2013-04-28 NOTE — ED Provider Notes (Signed)
Medical screening examination/treatment/procedure(s) were performed by non-physician practitioner and as supervising physician I was immediately available for consultation/collaboration.  EKG Interpretation    Date/Time:  Thursday April 27 2013 12:45:17 EST Ventricular Rate:  94 PR Interval:  154 QRS Duration: 90 QT Interval:  366 QTC Calculation: 457 R Axis:   77 Text Interpretation:  Normal sinus rhythm Normal ECG No old tracing to compare Confirmed by [facility]  MD, CHARLES [phone] on 04/27/2013 12:48:38 PM              Leonette Mostharles B. Bernette MayersSheldon, MD 04/28/13 [phone]

## 2013-09-12 ENCOUNTER — Emergency Department (HOSPITAL_COMMUNITY): Payer: 59

## 2013-09-12 ENCOUNTER — Encounter (HOSPITAL_COMMUNITY): Payer: Self-pay | Admitting: Emergency Medicine

## 2013-09-12 ENCOUNTER — Emergency Department (HOSPITAL_COMMUNITY)
Admission: EM | Admit: 2013-09-12 | Discharge: 2013-09-12 | Disposition: A | Discharge status: Home / Self Care | Payer: 59 | Attending: Emergency Medicine | Admitting: Emergency Medicine

## 2013-09-12 DIAGNOSIS — F17200 Nicotine dependence, unspecified, uncomplicated: Secondary | ICD-10-CM | POA: Insufficient documentation

## 2013-09-12 DIAGNOSIS — R51 Headache: Secondary | ICD-10-CM

## 2013-09-12 DIAGNOSIS — R519 Headache, unspecified: Secondary | ICD-10-CM

## 2013-09-12 DIAGNOSIS — G43909 Migraine, unspecified, not intractable, without status migrainosus: Secondary | ICD-10-CM | POA: Insufficient documentation

## 2013-09-12 MED ORDER — DIPHENHYDRAMINE HCL 50 MG/ML IJ SOLN
25.0000 mg | Freq: Once | INTRAMUSCULAR | Status: AC
  Administered 2013-09-12: 25 mg via INTRAVENOUS
  Filled 2013-09-12: qty 1

## 2013-09-12 MED ORDER — METOCLOPRAMIDE HCL 5 MG/ML IJ SOLN
10.0000 mg | Freq: Once | INTRAMUSCULAR | Status: AC
  Administered 2013-09-12: 10 mg via INTRAVENOUS
  Filled 2013-09-12: qty 2

## 2013-09-12 NOTE — ED Notes (Signed)
Patient transported to CT 
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
## 2013-09-12 NOTE — Discharge Instructions (Signed)

## 2013-12-24 ENCOUNTER — Encounter (HOSPITAL_COMMUNITY): Payer: Self-pay | Admitting: Emergency Medicine

## 2013-12-24 ENCOUNTER — Emergency Department (HOSPITAL_COMMUNITY): Payer: 59

## 2013-12-24 ENCOUNTER — Emergency Department (HOSPITAL_COMMUNITY)
Admission: EM | Admit: 2013-12-24 | Discharge: 2013-12-24 | Discharge status: Against Medical Advice | Payer: 59 | Attending: Emergency Medicine | Admitting: Emergency Medicine

## 2013-12-24 DIAGNOSIS — K0889 Other specified disorders of teeth and supporting structures: Secondary | ICD-10-CM

## 2013-12-24 DIAGNOSIS — Y92410 Unspecified street and highway as the place of occurrence of the external cause: Secondary | ICD-10-CM | POA: Insufficient documentation

## 2013-12-24 DIAGNOSIS — S1093XA Contusion of unspecified part of neck, initial encounter: Secondary | ICD-10-CM | POA: Diagnosis not present

## 2013-12-24 DIAGNOSIS — F17200 Nicotine dependence, unspecified, uncomplicated: Secondary | ICD-10-CM | POA: Diagnosis not present

## 2013-12-24 DIAGNOSIS — Z79899 Other long term (current) drug therapy: Secondary | ICD-10-CM | POA: Insufficient documentation

## 2013-12-24 DIAGNOSIS — S0993XA Unspecified injury of face, initial encounter: Secondary | ICD-10-CM | POA: Insufficient documentation

## 2013-12-24 DIAGNOSIS — K089 Disorder of teeth and supporting structures, unspecified: Secondary | ICD-10-CM | POA: Insufficient documentation

## 2013-12-24 DIAGNOSIS — S0003XA Contusion of scalp, initial encounter: Secondary | ICD-10-CM | POA: Insufficient documentation

## 2013-12-24 DIAGNOSIS — Y9389 Activity, other specified: Secondary | ICD-10-CM | POA: Diagnosis not present

## 2013-12-24 DIAGNOSIS — G43909 Migraine, unspecified, not intractable, without status migrainosus: Secondary | ICD-10-CM | POA: Diagnosis not present

## 2013-12-24 DIAGNOSIS — S0083XA Contusion of other part of head, initial encounter: Secondary | ICD-10-CM | POA: Insufficient documentation

## 2013-12-24 DIAGNOSIS — S199XXA Unspecified injury of neck, initial encounter: Secondary | ICD-10-CM

## 2013-12-24 NOTE — ED Notes (Signed)
Breakfast tray ordered. Will ambulate pt. After he eats.Marland Kitchen
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
## 2013-12-24 NOTE — ED Notes (Signed)
Pt. Got up oob and ate breakfast, took off his gown and got dressed.  Took of the vital sign BP and O2 and leads.  Pt. Ambulating in the hallway gait steady.  Pt. Refused an [employer].
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
## 2013-12-24 NOTE — ED Notes (Signed)
Notified PA that Patient was staggering.

## 2013-12-24 NOTE — ED Notes (Signed)
EMT reported that pt. Left his room and walked out.  Encouraged pt. To stay , he kept walking.  Reported to Johnnette Gourd, [place].

## 2013-12-24 NOTE — ED Notes (Signed)
Patient transported to CT 

## 2013-12-26 NOTE — ED Provider Notes (Signed)
Medical screening examination/treatment/procedure(s) were performed by non-physician practitioner and as supervising physician I was immediately available for consultation/collaboration.   EKG Interpretation None        Layla MawKristen N Allis Quirarte, DO 12/26/13 1502

## 2014-03-09 DIAGNOSIS — K0262 Dental caries on smooth surface penetrating into dentin: Secondary | ICD-10-CM | POA: Insufficient documentation

## 2014-03-09 DIAGNOSIS — Z720 Tobacco use: Secondary | ICD-10-CM | POA: Diagnosis not present

## 2014-03-09 DIAGNOSIS — Z8679 Personal history of other diseases of the circulatory system: Secondary | ICD-10-CM | POA: Diagnosis not present

## 2014-03-09 DIAGNOSIS — Z7982 Long term (current) use of aspirin: Secondary | ICD-10-CM | POA: Diagnosis not present

## 2014-03-09 DIAGNOSIS — Z79899 Other long term (current) drug therapy: Secondary | ICD-10-CM | POA: Insufficient documentation

## 2014-03-09 DIAGNOSIS — R110 Nausea: Secondary | ICD-10-CM | POA: Diagnosis not present

## 2014-03-09 DIAGNOSIS — K088 Other specified disorders of teeth and supporting structures: Secondary | ICD-10-CM | POA: Diagnosis present

## 2014-03-10 ENCOUNTER — Emergency Department (HOSPITAL_COMMUNITY)
Admission: EM | Admit: 2014-03-10 | Discharge: 2014-03-10 | Disposition: A | Discharge status: Home / Self Care | Payer: 59 | Attending: Emergency Medicine | Admitting: Emergency Medicine

## 2014-03-10 ENCOUNTER — Encounter (HOSPITAL_COMMUNITY): Payer: Self-pay | Admitting: Emergency Medicine

## 2014-03-10 DIAGNOSIS — K0262 Dental caries on smooth surface penetrating into dentin: Secondary | ICD-10-CM

## 2014-03-10 MED ORDER — ONDANSETRON 4 MG PO TBDP: 4.0000 mg | ORAL_TABLET | Freq: Three times a day (TID) | ORAL | Status: AC | PRN

## 2014-03-10 MED ORDER — OXYCODONE-ACETAMINOPHEN 5-325 MG PO TABS
1.0000 | ORAL_TABLET | Freq: Once | ORAL | Status: AC
  Administered 2014-03-10: 1 via ORAL
  Filled 2014-03-10: qty 1

## 2014-03-10 MED ORDER — OXYCODONE-ACETAMINOPHEN 5-325 MG PO TABS: 1.0000 | ORAL_TABLET | Freq: Four times a day (QID) | ORAL | Status: AC | PRN

## 2014-03-10 NOTE — ED Notes (Signed)
Pt. reports left upper molar pain " it broke "  onset this evening .

## 2014-03-10 NOTE — Discharge Instructions (Signed)
Dental Caries Dental caries is tooth decay. This decay can cause a hole in teeth (cavity) that can get bigger and deeper over time. HOME CARE  Brush and floss your teeth. Do this at least two times a day.  Use a fluoride toothpaste.  Use a mouth rinse if told by your dentist or doctor.  Eat less sugary and starchy foods. Drink less sugary drinks.  Avoid snacking often on sugary and starchy foods. Avoid sipping often on sugary drinks.  Keep regular checkups and cleanings with your dentist.  Use fluoride supplements if told by your dentist or doctor.  Allow fluoride to be applied to teeth if told by your dentist or doctor. Document Released: 12/24/2007 Document Revised: 07/31/2013 Document Reviewed: 03/18/2012 [facility] Patient Information 2015 [place], [place]. This information is not intended to replace advice given to you by your health care provider. Make sure you discuss any questions you have with your health care provider.  Dental Pain A tooth ache may be caused by cavities (tooth decay). Cavities expose the nerve of the tooth to air and hot or cold temperatures. It may come from an infection or abscess (also called a boil or furuncle) around your tooth. It is also often caused by dental caries (tooth decay). This causes the pain you are having. DIAGNOSIS  Your caregiver can diagnose this problem by exam. TREATMENT   If caused by an infection, it may be treated with medications which kill germs (antibiotics) and pain medications as prescribed by your caregiver. Take medications as directed.  Only take over-the-counter or prescription medicines for pain, discomfort, or fever as directed by your caregiver.  Whether the tooth ache today is caused by infection or dental disease, you should see your dentist as soon as possible for further care. SEEK MEDICAL CARE IF: The exam and treatment you received today has been provided on an emergency basis only. This is not a substitute for  complete medical or dental care. If your problem worsens or new problems (symptoms) appear, and you are unable to meet with your dentist, call or return to this location. SEEK IMMEDIATE MEDICAL CARE IF:   You have a fever.  You develop redness and swelling of your face, jaw, or neck.  You are unable to open your mouth.  You have severe pain uncontrolled by pain medicine. MAKE SURE YOU:   Understand these instructions.  Will watch your condition.  Will get help right away if you are not doing well or get worse. Document Released: 03/16/2005 Document Revised: 06/08/2011 Document Reviewed: 11/02/2007 [facility] Patient Information 2015 [place], [place]. This information is not intended to replace advice given to you by your health care provider. Make sure you discuss any questions you have with your health care provider. Call your dentist even thought the office is closed to let them know of your situation to allow them to get you an appointment

## 2014-03-10 NOTE — ED Provider Notes (Signed)
CSN: [id number]     Arrival date & time 03/09/14  2327 History   First MD Initiated Contact with Patient 03/10/14 0016     Chief Complaint  Patient presents with  . Dental Pain     (Consider location/radiation/quality/duration/timing/severity/associated sxs/prior Treatment) HPI Comments: Pateint states tooth"broke" tonight Has not taken any medication for discomfort [place] not call   Patient is a 33 y.o. male presenting with tooth pain. The history is provided by the patient.  Dental Pain Location:  Upper Upper teeth location:  15/LU 2nd molar Quality:  Aching Severity:  Moderate Onset quality:  Sudden Duration:  10 hours Timing:  Constant Progression:  Unchanged Chronicity:  New Context: dental caries   Relieved by:  None tried Worsened by:  Cold food/drink Ineffective treatments:  None tried Associated symptoms: no difficulty swallowing, no drooling, no facial pain, no facial swelling, no fever, no headaches, no neck pain and no trismus   Associated symptoms comment:  Nausea   Past Medical History  Diagnosis Date  . Migraines    Past Surgical History  Procedure Laterality Date  . Eye surgery    . Appendectomy    . I&d extremity Left 08/07/2012    Procedure: IRRIGATION AND DEBRIDEMENT EXTREMITY;  Surgeon: Dominica SeverinWilliam Gramig, MD;  [facility];  Service: Orthopedics;  Laterality: Left;  . Nerve and tendon repair Left 08/07/2012    Procedure: NERVE AND TENDON REPAIR;  Surgeon: Dominica SeverinWilliam Gramig, MD;  Location: [facility] OR;  Service: Orthopedics;  Laterality: Left;  . Open reduction internal fixation (orif) hand Left 08/07/2012    Procedure: OPEN REDUCTION INTERNAL FIXATION (ORIF) HAND;  Surgeon: Dominica SeverinWilliam Gramig, MD;  Location: [facility] OR;  Service: Orthopedics;  Laterality: Left;   No family history on file. History  Substance Use Topics  . Smoking status: Current Every Day Smoker -- 1.00 packs/day  . Smokeless tobacco: Not on file  . Alcohol Use:  1.2 oz/week    2 Cans of beer per week     Comment: most days    Review of Systems  Constitutional: Negative for fever.  HENT: Positive for dental problem. Negative for drooling and facial swelling.   Gastrointestinal: Positive for nausea.  Musculoskeletal: Negative for neck pain.  Neurological: Negative for dizziness and headaches.  All other systems reviewed and are negative.     Allergies  Other and Anesthetics, amide  Home Medications   Prior to Admission medications   Medication Sig Start Date End Date Taking? Authorizing Provider  albuterol (PROVENTIL HFA;VENTOLIN HFA) 108 (90 BASE) MCG/ACT inhaler Inhale 2 puffs into the lungs every 6 (six) hours as needed for wheezing or shortness of breath. 04/27/13   Jennifer L Piepenbrink, PA-C  Aspirin-Acetaminophen-Caffeine (GOODY HEADACHE PO) Take 1 packet by mouth every 4 (four) hours as needed (for headache).    Historical Provider, MD  ibuprofen (ADVIL,MOTRIN) 200 MG tablet Take 400 mg by mouth every 6 (six) hours as needed for headache.    Historical Provider, MD  ondansetron (ZOFRAN ODT) 4 MG disintegrating tablet Take 1 tablet (4 mg total) by mouth every 8 (eight) hours as needed for nausea or vomiting. 03/10/14   Arman FilterGail K Emila Steinhauser, NP  oxyCODONE-acetaminophen (PERCOCET/ROXICET) 5-325 MG per tablet Take 1-2 tablets by mouth every 6 (six) hours as needed for severe pain. 03/10/14   Arman FilterGail K Adis Sturgill, NP   BP 143/108 mmHg  Pulse 97  Temp(Src) 98.1 F (36.7 C)  Resp 20  SpO2 96%  Physical Exam  Constitutional: He appears well-developed and well-nourished.  HENT:  Head: Normocephalic.  Eyes: Pupils are equal, round, and reactive to light.  Neck: Normal range of motion.  Cardiovascular: Normal rate.   Pulmonary/Chest: Effort normal.  Musculoskeletal: Normal range of motion.  Neurological: He is alert.  Skin: Skin is warm and dry.  Vitals reviewed.   ED Course  Procedures (including critical care time) Labs Review Labs Reviewed  - No data to display  Imaging Review No results found.   EKG Interpretation None      MDM   Final diagnoses:  Dental caries extending into dentin         Arman FilterGail K Analyssa Downs, NP 03/10/14 [id number]  Loren Raceravid Yelverton, MD 03/10/14 [phone]
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
